# Patient Record
Sex: Female | Born: 1955 | Race: White | Hispanic: No | Marital: Married | Smoking: Never smoker
Health system: Southern US, Community
[De-identification: ages and names within clinical notes are randomized; demographics above are authoritative.]

## PROBLEM LIST (undated history)

## (undated) DIAGNOSIS — S4292XA Fracture of left shoulder girdle, part unspecified, initial encounter for closed fracture: Secondary | ICD-10-CM

## (undated) DIAGNOSIS — E039 Hypothyroidism, unspecified: Secondary | ICD-10-CM

## (undated) DIAGNOSIS — K219 Gastro-esophageal reflux disease without esophagitis: Secondary | ICD-10-CM

## (undated) DIAGNOSIS — F419 Anxiety disorder, unspecified: Secondary | ICD-10-CM

## (undated) DIAGNOSIS — Z1211 Encounter for screening for malignant neoplasm of colon: Secondary | ICD-10-CM

## (undated) DIAGNOSIS — E669 Obesity, unspecified: Secondary | ICD-10-CM

## (undated) DIAGNOSIS — R51 Headache: Secondary | ICD-10-CM

## (undated) DIAGNOSIS — J189 Pneumonia, unspecified organism: Secondary | ICD-10-CM

## (undated) DIAGNOSIS — N6019 Diffuse cystic mastopathy of unspecified breast: Secondary | ICD-10-CM

## (undated) DIAGNOSIS — I10 Essential (primary) hypertension: Secondary | ICD-10-CM

## (undated) DIAGNOSIS — G2401 Drug induced subacute dyskinesia: Secondary | ICD-10-CM

## (undated) DIAGNOSIS — N939 Abnormal uterine and vaginal bleeding, unspecified: Secondary | ICD-10-CM

## (undated) DIAGNOSIS — F32A Depression, unspecified: Secondary | ICD-10-CM

## (undated) DIAGNOSIS — F329 Major depressive disorder, single episode, unspecified: Secondary | ICD-10-CM

## (undated) DIAGNOSIS — T7840XA Allergy, unspecified, initial encounter: Secondary | ICD-10-CM

## (undated) HISTORY — PX: TONSILLECTOMY: SHX5217

## (undated) HISTORY — DX: Encounter for screening for malignant neoplasm of colon: Z12.11

## (undated) HISTORY — DX: Drug induced subacute dyskinesia: G24.01

## (undated) HISTORY — PX: COLONOSCOPY: SHX174

## (undated) HISTORY — PX: BREAST CYST ASPIRATION: SHX578

## (undated) HISTORY — DX: Allergy, unspecified, initial encounter: T78.40XA

## (undated) HISTORY — DX: Pneumonia, unspecified organism: J18.9

## (undated) HISTORY — DX: Diffuse cystic mastopathy of unspecified breast: N60.19

## (undated) HISTORY — PX: DILATION AND CURETTAGE OF UTERUS: SHX78

## (undated) HISTORY — DX: Fracture of left shoulder girdle, part unspecified, initial encounter for closed fracture: S42.92XA

## (undated) HISTORY — DX: Obesity, unspecified: E66.9

## (undated) HISTORY — DX: Abnormal uterine and vaginal bleeding, unspecified: N93.9

---

## 1986-08-20 HISTORY — PX: NASAL SINUS SURGERY: SHX719

## 2000-01-31 ENCOUNTER — Other Ambulatory Visit: Admission: RE | Admit: 2000-01-31 | Discharge: 2000-01-31 | Payer: Self-pay | Admitting: Obstetrics and Gynecology

## 2001-05-27 ENCOUNTER — Other Ambulatory Visit: Admission: RE | Admit: 2001-05-27 | Discharge: 2001-05-27 | Payer: Self-pay | Admitting: Obstetrics and Gynecology

## 2002-06-11 ENCOUNTER — Other Ambulatory Visit: Admission: RE | Admit: 2002-06-11 | Discharge: 2002-06-11 | Payer: Self-pay | Admitting: Obstetrics and Gynecology

## 2002-08-20 HISTORY — PX: BREAST BIOPSY: SHX20

## 2003-08-21 HISTORY — PX: EXTERNAL EAR SURGERY: SHX627

## 2003-08-24 ENCOUNTER — Other Ambulatory Visit: Admission: RE | Admit: 2003-08-24 | Discharge: 2003-08-24 | Payer: Self-pay | Admitting: Obstetrics and Gynecology

## 2004-09-29 IMAGING — MG UNKNOWN MG STUDY
1 series · 4 of 4 positions shown · non-contrast
Comparison: none

REASON FOR EXAM: breast mass...hm [phone]

[L CC · left · 4 of 4 slices shown]
[im 1/4]
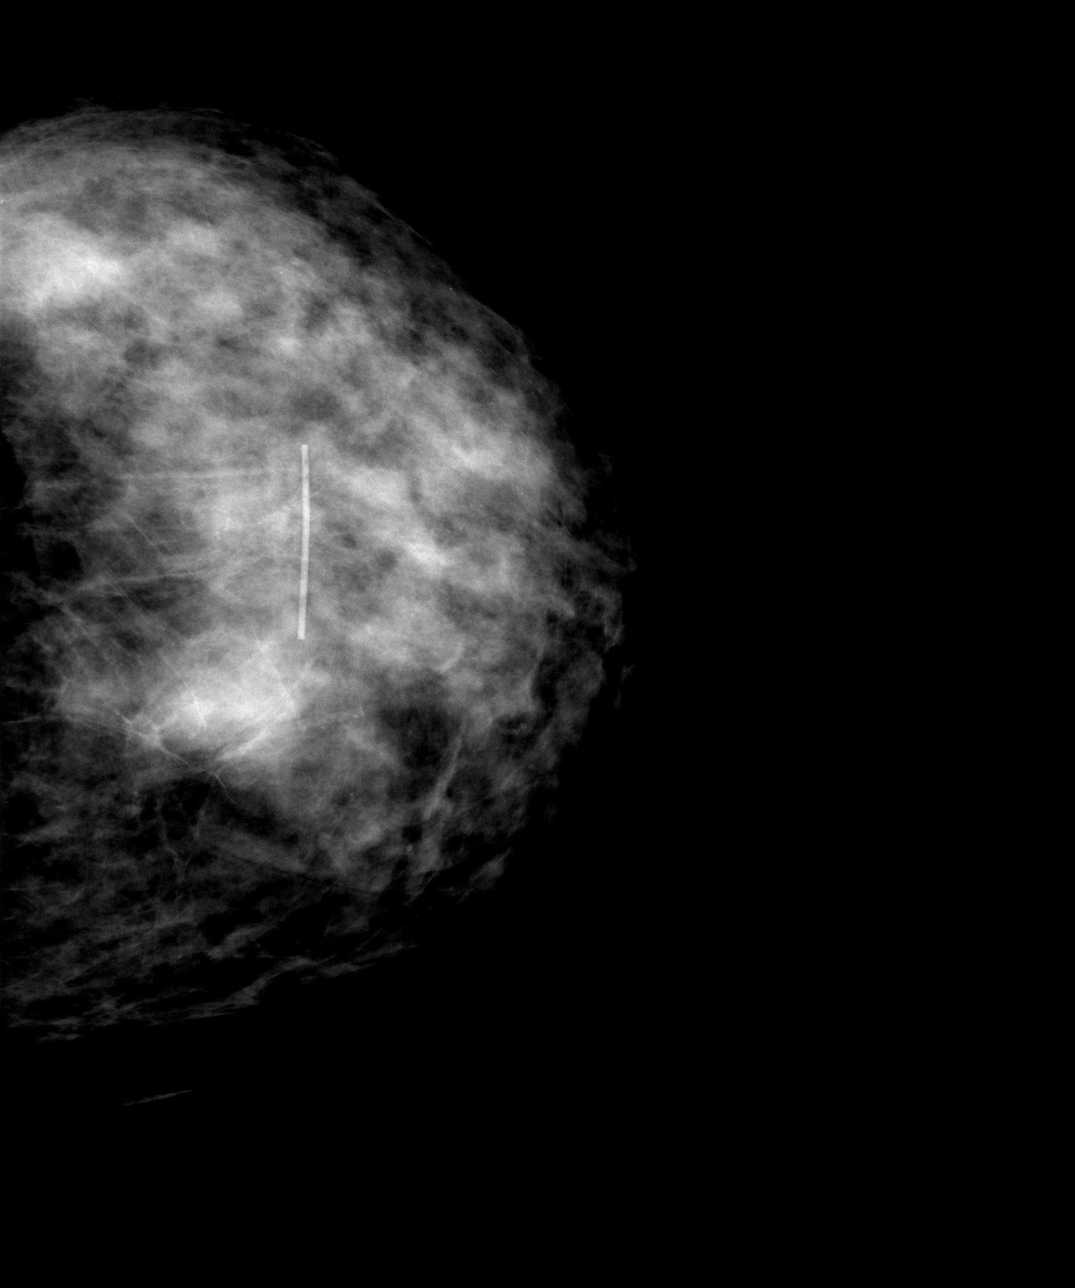
[im 2/4]
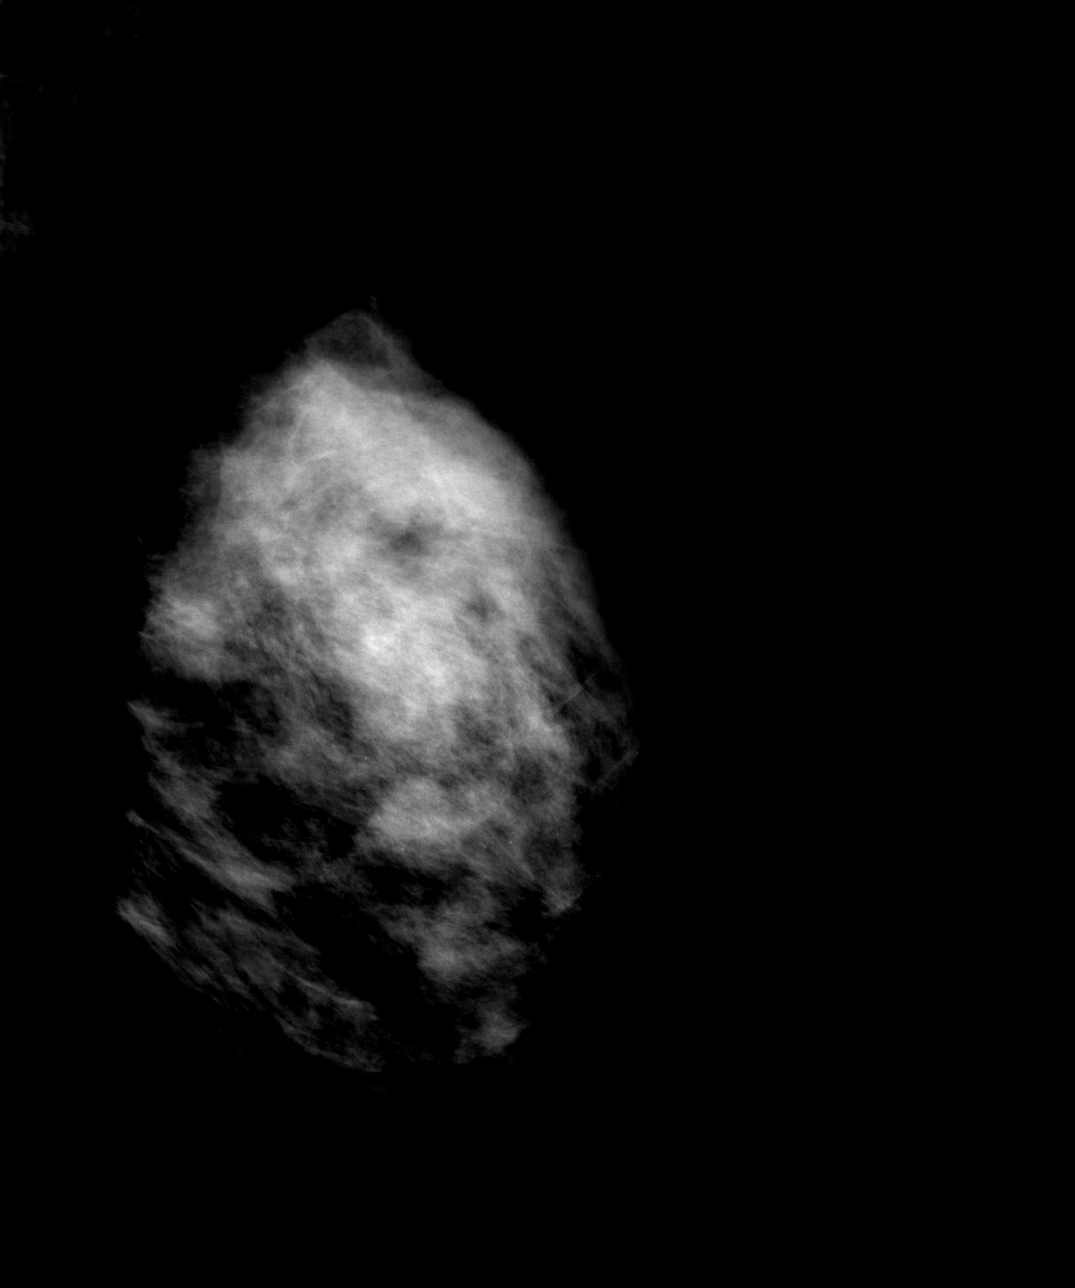
[im 3/4]
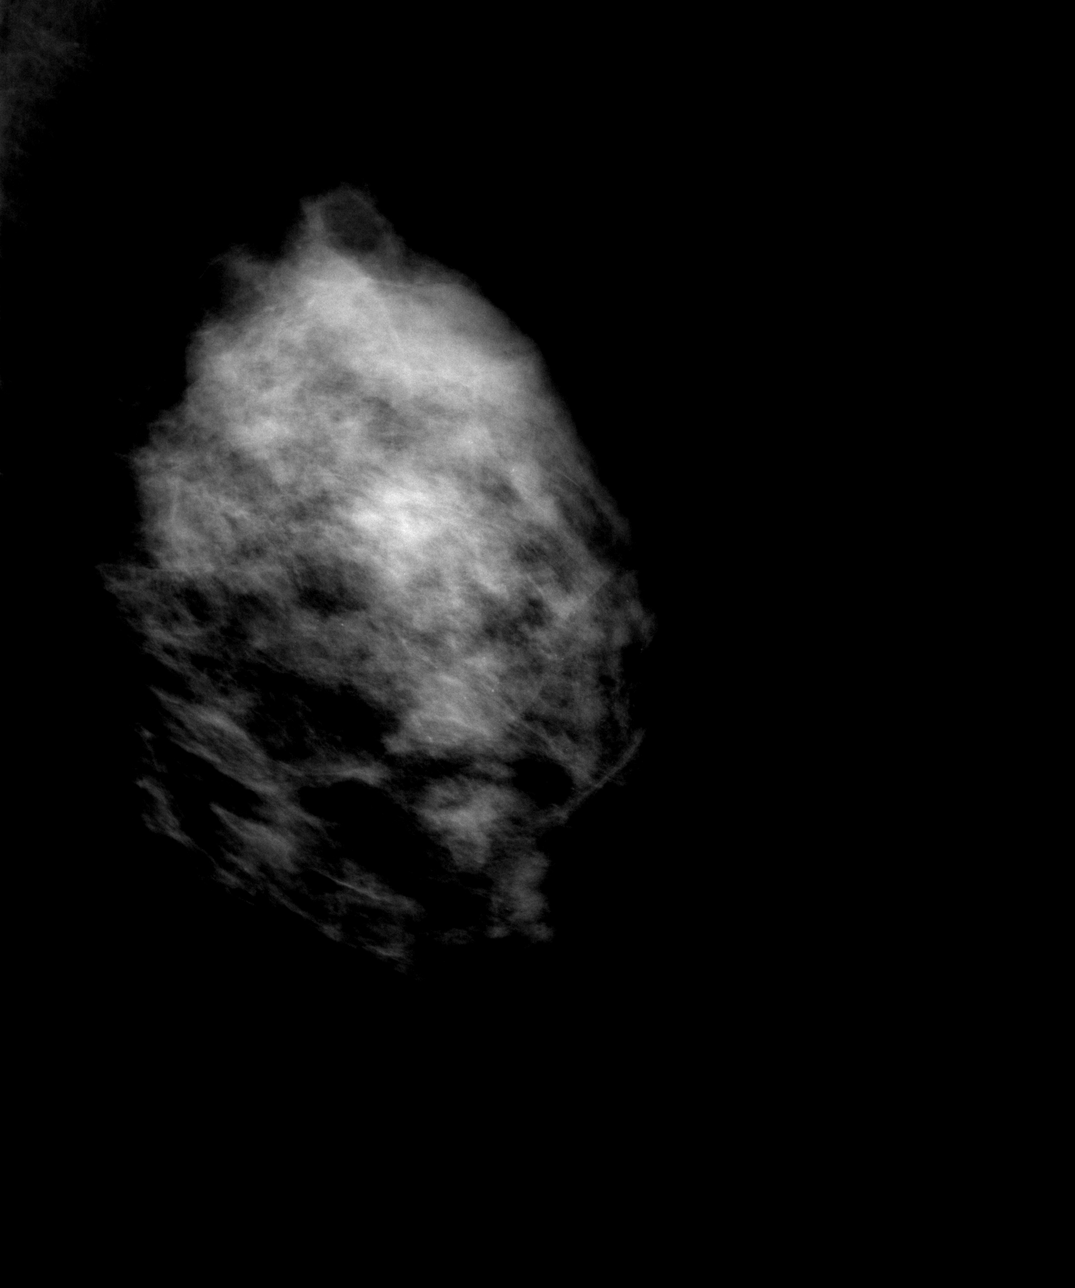
[im 4/4]
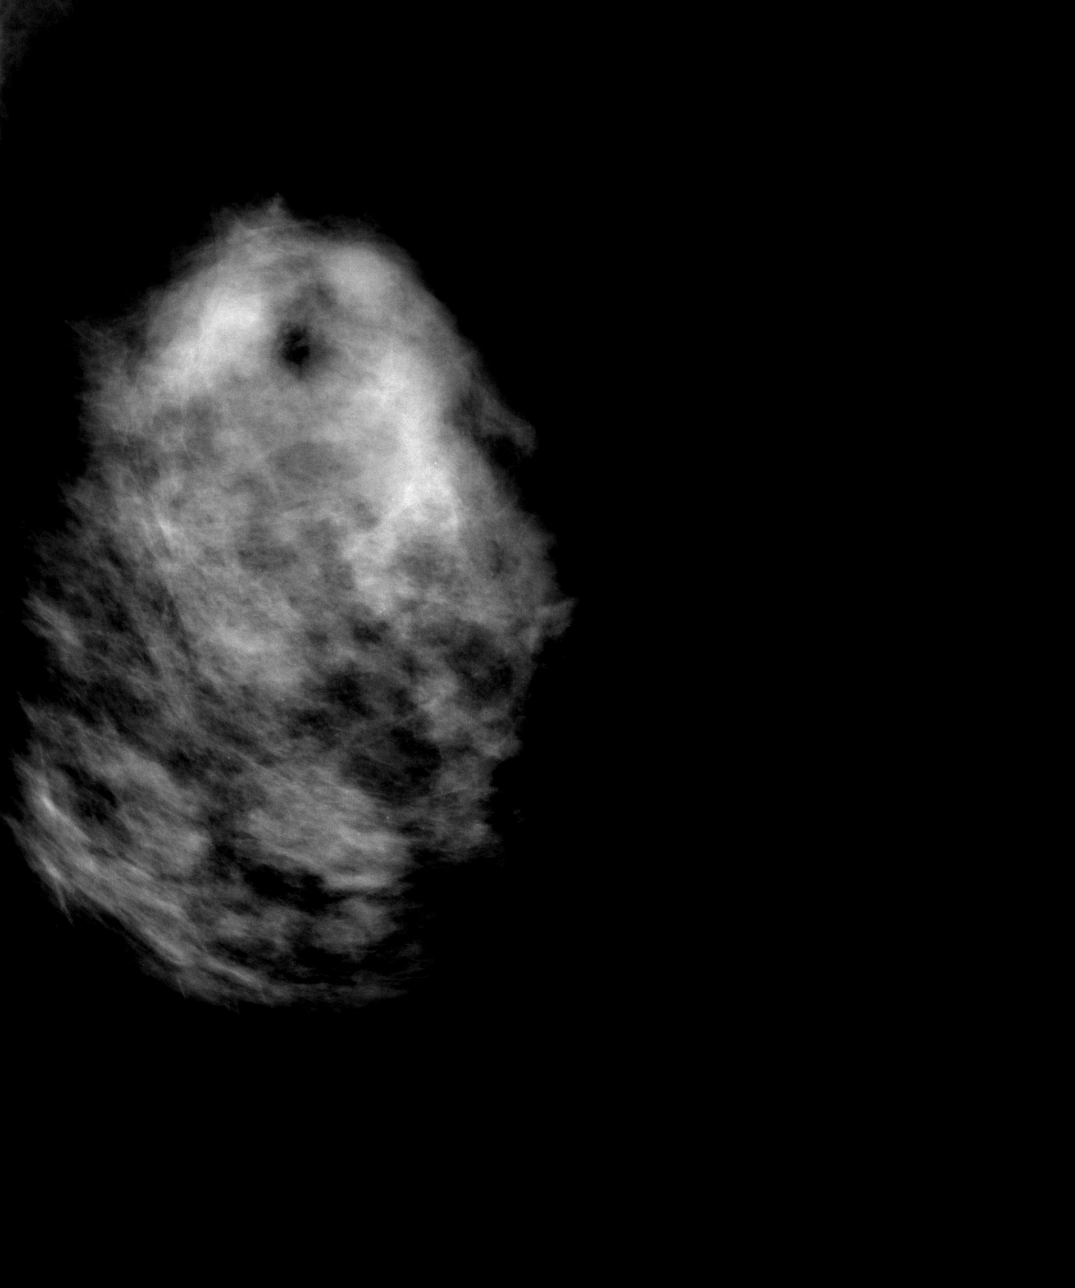

[4 of 4 positions shown; findings below may reference images not displayed]

Procedure: DIGITAL MAMMOGRAM OF THE LEFT BREAST 30 August, 2003:

 The patient has undergone surgery in the [REDACTED] of 1441 on the LEFT. In the
past the patient has had dominant density noted superiorly and laterally.
This is again seen. A stereotactic biopsy marker has been placed at
approximately the 12 oclock position of the upper retroareolar region. I
see no malignant appearing grouping of microcalcification. Very faint
microcalcifications are present on the LEFT in the lateral retroareolar
region but these do not appreciably changed.
IMPRESSION: 1)There are microcalcifications noted on the LEFT which are likely
reflective of sclerosing adenosis. I do not see findings that suggest
malignancy.

 BI-RADS: Category 3-Probably Benign Finding (interval follow-up).
Additional six month follow-up mammogram of the LEFT breast is recommended
at which time the RIGHT breast would be due i.e., in Wednesday February, 2004.

 A NEGATIVE MAMMOGRAM REPORT DOES NOT PRECLUDE BIOPSY OR OTHER EVALUATION OF
A CLINICALLY PALPABLE OR OTHERWISE SUSPICIOUS MASS OR LESION. BREAST CANCER
MAY NOT BE DETECTED BY MAMMOGRAPHY IN UP TO 10% OF CASES.

## 2004-11-20 ENCOUNTER — Other Ambulatory Visit: Admission: RE | Admit: 2004-11-20 | Discharge: 2004-11-20 | Payer: Self-pay | Admitting: Obstetrics and Gynecology

## 2005-05-03 ENCOUNTER — Ambulatory Visit: Payer: Self-pay | Admitting: General Surgery

## 2005-11-21 ENCOUNTER — Other Ambulatory Visit: Admission: RE | Admit: 2005-11-21 | Discharge: 2005-11-21 | Payer: Self-pay | Admitting: Obstetrics & Gynecology

## 2006-05-23 ENCOUNTER — Ambulatory Visit: Payer: Self-pay | Admitting: General Surgery

## 2006-11-27 ENCOUNTER — Other Ambulatory Visit: Admission: RE | Admit: 2006-11-27 | Discharge: 2006-11-27 | Payer: Self-pay | Admitting: Obstetrics and Gynecology

## 2007-05-27 ENCOUNTER — Ambulatory Visit: Payer: Self-pay | Admitting: General Surgery

## 2007-06-21 HISTORY — PX: BREAST SURGERY: SHX581

## 2007-08-21 DIAGNOSIS — K219 Gastro-esophageal reflux disease without esophagitis: Secondary | ICD-10-CM

## 2007-08-21 HISTORY — DX: Gastro-esophageal reflux disease without esophagitis: K21.9

## 2007-12-03 ENCOUNTER — Other Ambulatory Visit: Admission: RE | Admit: 2007-12-03 | Discharge: 2007-12-03 | Payer: Self-pay | Admitting: Obstetrics and Gynecology

## 2008-02-10 ENCOUNTER — Ambulatory Visit: Payer: Self-pay | Admitting: Internal Medicine

## 2008-02-27 ENCOUNTER — Ambulatory Visit: Payer: Self-pay | Admitting: Internal Medicine

## 2008-05-27 ENCOUNTER — Ambulatory Visit: Payer: Self-pay | Admitting: General Surgery

## 2008-06-07 ENCOUNTER — Ambulatory Visit: Payer: Self-pay | Admitting: General Surgery

## 2008-12-29 ENCOUNTER — Other Ambulatory Visit: Admission: RE | Admit: 2008-12-29 | Discharge: 2008-12-29 | Payer: Self-pay | Admitting: Obstetrics and Gynecology

## 2009-05-31 ENCOUNTER — Ambulatory Visit: Payer: Self-pay | Admitting: General Surgery

## 2009-06-03 ENCOUNTER — Ambulatory Visit: Payer: Self-pay | Admitting: General Surgery

## 2009-07-08 IMAGING — MG MAM DGTL ADD VW LT  SCR
2 series · 5 of 5 positions shown · non-contrast
Comparison: none

REASON FOR EXAM: left breast focal asymmetry
COMMENTS:

PROCEDURE:     MAM - MAM DGTL ADD VW LT  SCR  - June 07, 2008  [DATE]
RESULT:
HISTORY: 51-year-old female presents for further evaluation of LEFT breast
focal asymmetry.
COMPARISONS: 01-24-01, 02-28-04, 05-03-05, 05-23-06 and 05-27-07.

[L ML · left · 4 of 4 slices shown]
[im 1/4]
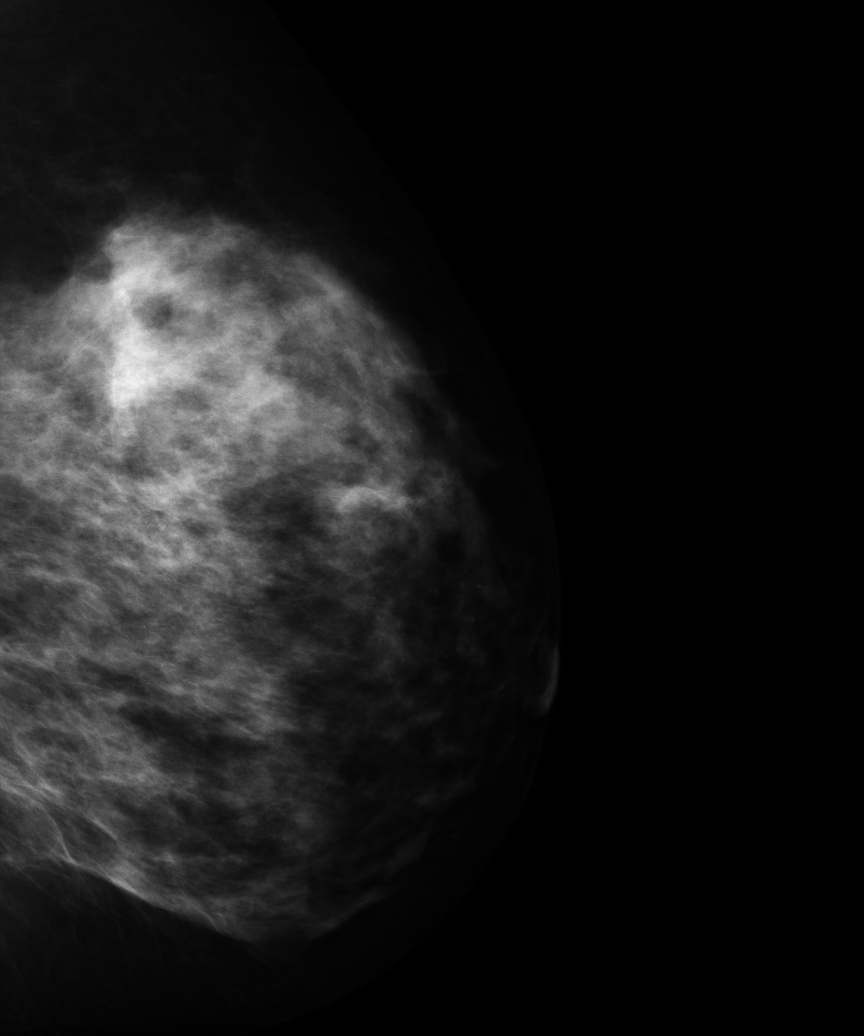
[im 2/4]
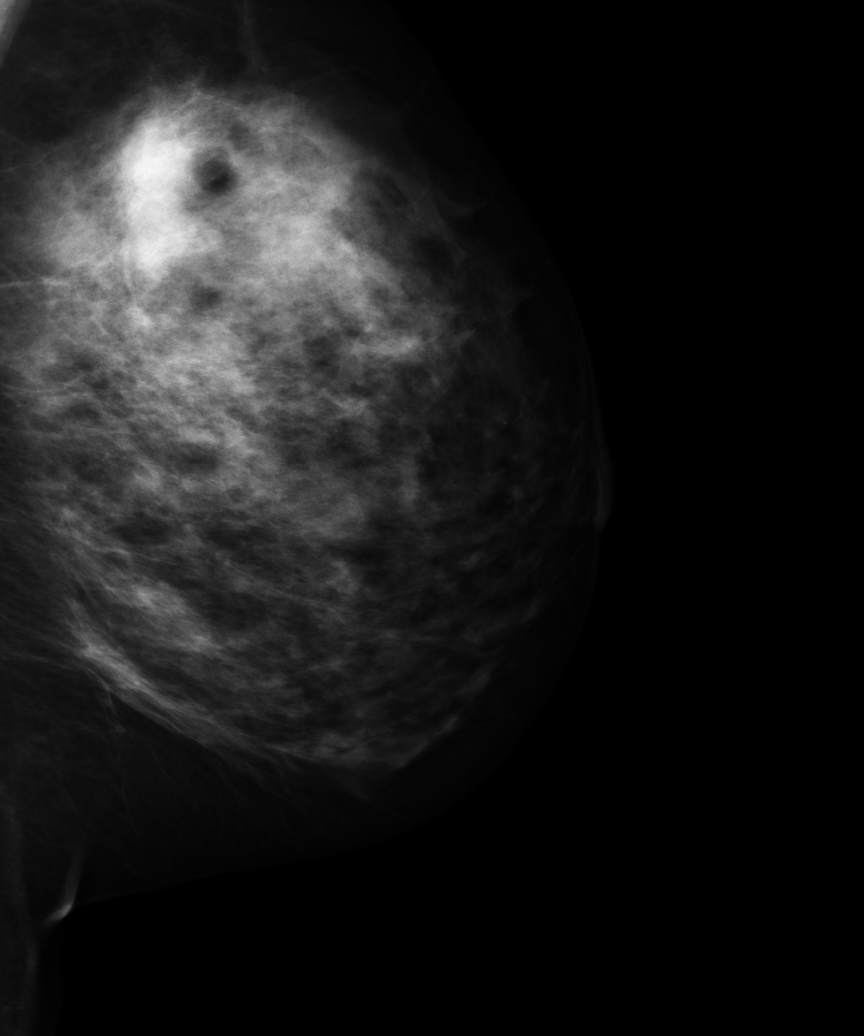
[im 3/4]
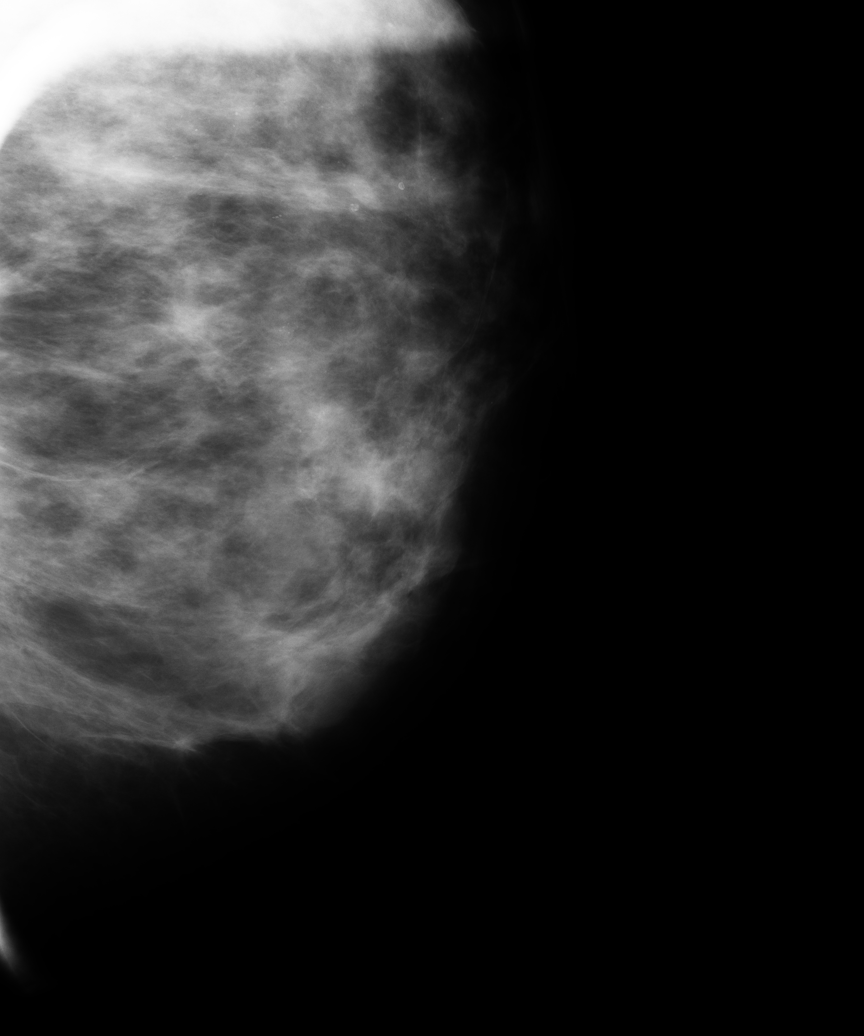
[im 4/4]
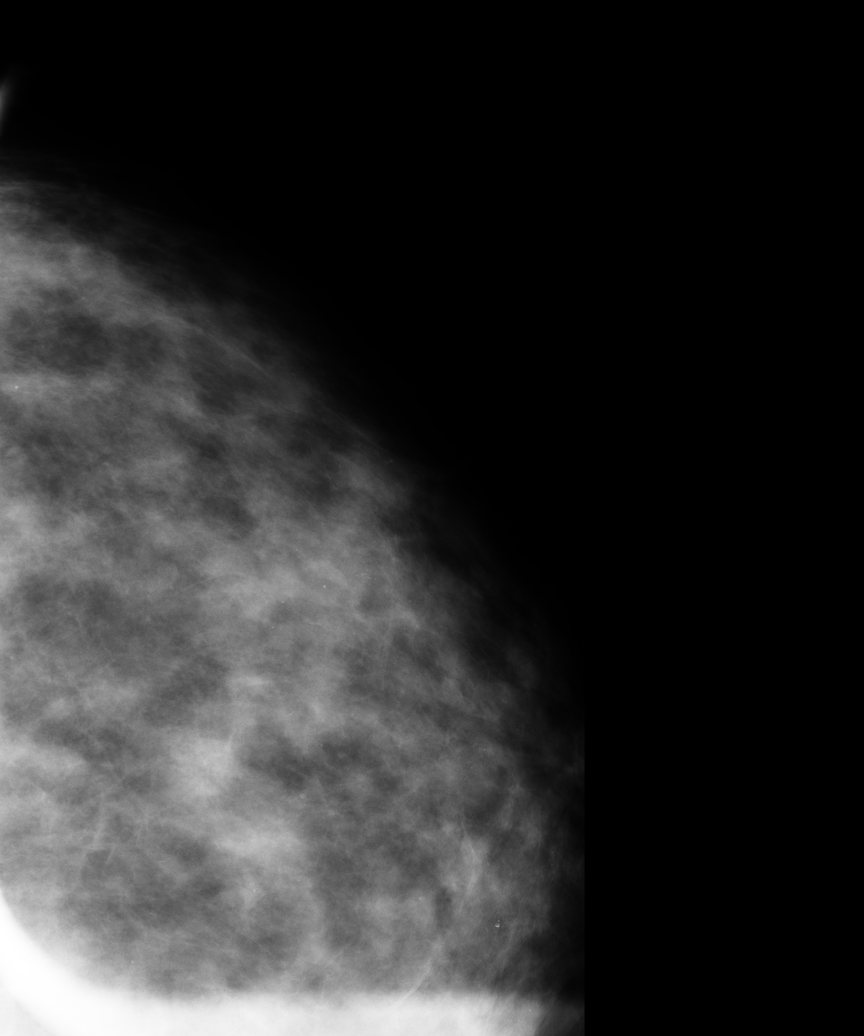

[L MLO]
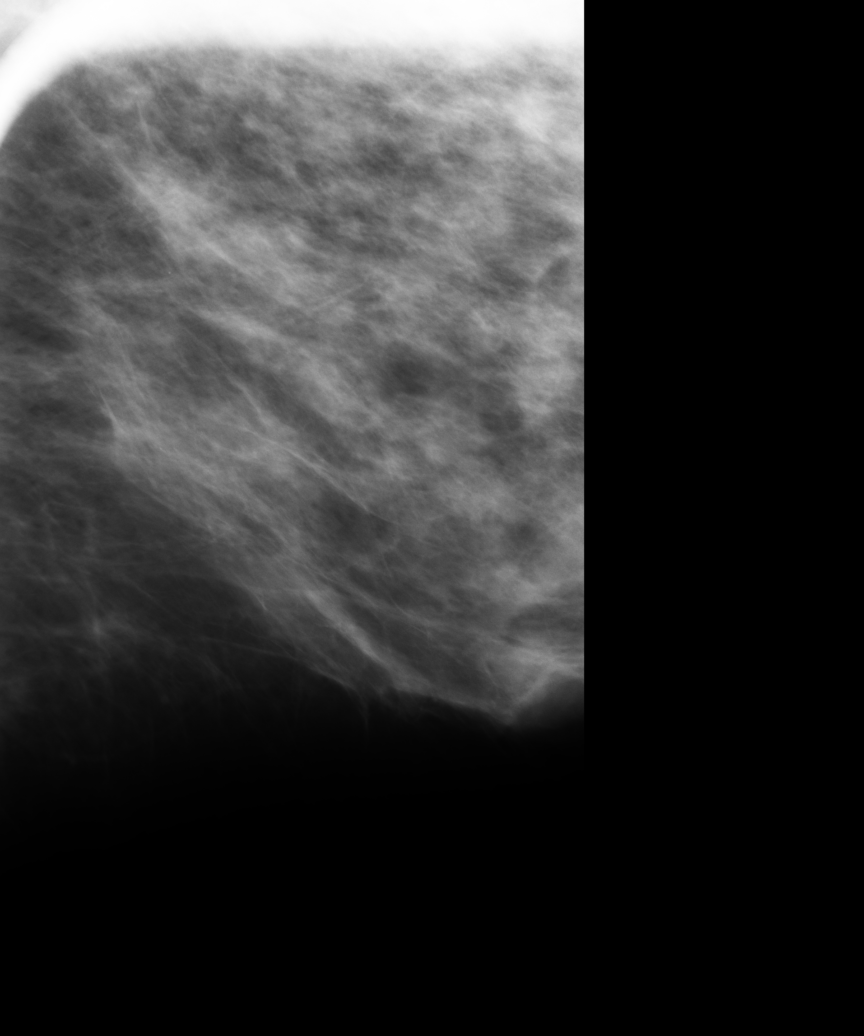

[5 of 5 positions shown; findings below may reference images not displayed]

FINDINGS: The LEFT breast is extremely dense which lowers the sensitivity of
mammography.

There is a focal asymmetry in the inferior LEFT breast not seen on the CC
view. Further evaluation is performed with true lateral view of the LEFT
breast and spot magnification MLO and CC views of the LEFT breast. With
compression the area of concern blends in with adjacent fibroglandular
tissue. There is no persistent mass or architectural distortion. There are
no suspicious appearing clusters of microcalcification.
IMPRESSION: BI-RADS: Category 2-Benign Finding

1. Return to annual mammographic follow-up is recommended.

A NEGATIVE MAMMOGRAM REPORT DOES NOT PRECLUDE BIOPSY OR OTHER EVALUATION OF
A CLINICALLY PALPABLE OR OTHERWISE SUSPICIOUS MASS OR LESION. BREAST CANCER
MAY NOT BE DETECTED BY MAMMOGRAPHY IN UP TO 10% OF CASES.

## 2009-08-20 DIAGNOSIS — J189 Pneumonia, unspecified organism: Secondary | ICD-10-CM

## 2009-08-20 DIAGNOSIS — E039 Hypothyroidism, unspecified: Secondary | ICD-10-CM

## 2009-08-20 HISTORY — DX: Hypothyroidism, unspecified: E03.9

## 2009-08-20 HISTORY — DX: Pneumonia, unspecified organism: J18.9

## 2009-08-20 HISTORY — PX: TRACHEOSTOMY: SUR1362

## 2010-06-01 ENCOUNTER — Ambulatory Visit: Payer: Self-pay | Admitting: General Surgery

## 2010-08-20 DIAGNOSIS — Z1211 Encounter for screening for malignant neoplasm of colon: Secondary | ICD-10-CM

## 2010-08-20 HISTORY — DX: Encounter for screening for malignant neoplasm of colon: Z12.11

## 2011-06-05 ENCOUNTER — Ambulatory Visit: Payer: Self-pay | Admitting: General Surgery

## 2011-08-21 DIAGNOSIS — N6019 Diffuse cystic mastopathy of unspecified breast: Secondary | ICD-10-CM

## 2011-08-21 DIAGNOSIS — E669 Obesity, unspecified: Secondary | ICD-10-CM

## 2011-08-21 HISTORY — DX: Obesity, unspecified: E66.9

## 2011-08-21 HISTORY — DX: Diffuse cystic mastopathy of unspecified breast: N60.19

## 2012-03-20 DIAGNOSIS — N939 Abnormal uterine and vaginal bleeding, unspecified: Secondary | ICD-10-CM

## 2012-03-20 HISTORY — PX: HYSTEROSCOPY: SHX211

## 2012-03-20 HISTORY — DX: Abnormal uterine and vaginal bleeding, unspecified: N93.9

## 2012-04-04 ENCOUNTER — Encounter (HOSPITAL_COMMUNITY): Payer: Self-pay | Admitting: Pharmacist

## 2012-04-04 NOTE — H&P (Signed)
Deborah Chase is an 56 y.o. female.   Chief Complaint: vaginal bleeding HPI:G5P1 with menstrual like flow for two weeks in July.  PUS/SHSG showed 3 endometrial polyps, 1.7 cm, 1.5 cm. And 0.6 cm.  Uterus 7 x 4 x 5 cm.  Adnexa wnl.  PMH:  Prolonged hospitalization for ARDS, with tracheotomy in 2011.               Major depressive episode with hospitalization            Hypothyroid            HTN PSH:   C section             Breast bx  FH:      Brother with AODM  Social History: neg smoker.  Married, one child, Buyer, retail.  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  Husband Onalee Hua. Allergies: Sulfa causes mouth sores  Medications:  Levoxyl, metoprolol, wellbutrin, cymbalta  Review of Systems  All other systems reviewed and are negative.    There were no vitals taken for this visit. Physical Exam  Constitutional: She is oriented to person, place, and time. She appears well-developed.  HENT:  Head: Normocephalic and atraumatic.  Eyes: Conjunctivae are normal. Pupils are equal, round, and reactive to light.  Neck: Normal range of motion. Neck supple. No thyromegaly present.  Cardiovascular: Normal rate, regular rhythm and normal heart sounds.   Respiratory: Breath sounds normal. She is in respiratory distress. She has no wheezes.  GI: Soft. Bowel sounds are normal. She exhibits no distension.  Genitourinary: Vagina normal and uterus normal.  Musculoskeletal: Normal range of motion.  Neurological: She is alert and oriented to person, place, and time.  Skin: Skin is warm and dry.  Psychiatric: She has a normal mood and affect. Her behavior is normal. Thought content normal.     Assessment/Plan Post menopausal bleeding.  Endometrial polyps of SHSG.  Plan hysteroscopy with resection and D & C  [name],[name] 04/04/2012, 10:55 AM

## 2012-04-07 ENCOUNTER — Encounter (HOSPITAL_COMMUNITY): Payer: Self-pay | Admitting: *Deleted

## 2012-04-08 ENCOUNTER — Ambulatory Visit (HOSPITAL_COMMUNITY): Payer: Managed Care, Other (non HMO) | Admitting: Anesthesiology

## 2012-04-08 ENCOUNTER — Encounter (HOSPITAL_COMMUNITY)
Admission: RE | Disposition: A | Discharge status: Home / Self Care | Payer: Self-pay | Source: Ambulatory Visit | Attending: Obstetrics and Gynecology

## 2012-04-08 ENCOUNTER — Ambulatory Visit (HOSPITAL_COMMUNITY)
Admission: RE | Admit: 2012-04-08 | Discharge: 2012-04-08 | Disposition: A | Discharge status: Home / Self Care | Payer: Managed Care, Other (non HMO) | Source: Ambulatory Visit | Attending: Obstetrics and Gynecology | Admitting: Obstetrics and Gynecology

## 2012-04-08 ENCOUNTER — Encounter (HOSPITAL_COMMUNITY): Payer: Self-pay | Admitting: Anesthesiology

## 2012-04-08 DIAGNOSIS — N950 Postmenopausal bleeding: Secondary | ICD-10-CM | POA: Insufficient documentation

## 2012-04-08 DIAGNOSIS — N840 Polyp of corpus uteri: Secondary | ICD-10-CM | POA: Insufficient documentation

## 2012-04-08 HISTORY — DX: Essential (primary) hypertension: I10

## 2012-04-08 HISTORY — DX: Gastro-esophageal reflux disease without esophagitis: K21.9

## 2012-04-08 HISTORY — DX: Hypothyroidism, unspecified: E03.9

## 2012-04-08 HISTORY — DX: Anxiety disorder, unspecified: F41.9

## 2012-04-08 HISTORY — DX: Major depressive disorder, single episode, unspecified: F32.9

## 2012-04-08 HISTORY — DX: Depression, unspecified: F32.A

## 2012-04-08 HISTORY — DX: Headache: R51

## 2012-04-08 LAB — CBC
MCV: 92.5 fL (ref 78.0–100.0)
Platelets: 239 10*3/uL (ref 150–400)
RBC: 4.53 MIL/uL (ref 3.87–5.11)
RDW: 13.8 % (ref 11.5–15.5)
WBC: 6.4 10*3/uL (ref 4.0–10.5)

## 2012-04-08 SURGERY — DILATATION & CURETTAGE/HYSTEROSCOPY WITH RESECTOCOPE
Anesthesia: General | Site: Vagina | Wound class: Clean Contaminated

## 2012-04-08 MED ORDER — ONDANSETRON HCL 4 MG/2ML IJ SOLN
INTRAMUSCULAR | Status: AC
  Filled 2012-04-08: qty 2

## 2012-04-08 MED ORDER — PROPOFOL 10 MG/ML IV EMUL
INTRAVENOUS | Status: DC | PRN
  Administered 2012-04-08: 150 mg via INTRAVENOUS

## 2012-04-08 MED ORDER — KETOROLAC TROMETHAMINE 30 MG/ML IJ SOLN
INTRAMUSCULAR | Status: DC | PRN
  Administered 2012-04-08: 30 mg via INTRAVENOUS

## 2012-04-08 MED ORDER — GLYCINE 1.5 % IR SOLN
Status: DC | PRN
  Administered 2012-04-08: 3000 mL

## 2012-04-08 MED ORDER — ONDANSETRON HCL 4 MG/2ML IJ SOLN
INTRAMUSCULAR | Status: DC | PRN
  Administered 2012-04-08: 4 mg via INTRAVENOUS

## 2012-04-08 MED ORDER — LIDOCAINE HCL 1 % IJ SOLN
INTRAMUSCULAR | Status: DC | PRN
  Administered 2012-04-08: 20 mL

## 2012-04-08 MED ORDER — MIDAZOLAM HCL 2 MG/2ML IJ SOLN
INTRAMUSCULAR | Status: AC
  Filled 2012-04-08: qty 2

## 2012-04-08 MED ORDER — MIDAZOLAM HCL 5 MG/5ML IJ SOLN
INTRAMUSCULAR | Status: DC | PRN
  Administered 2012-04-08: 2 mg via INTRAVENOUS

## 2012-04-08 MED ORDER — FENTANYL CITRATE 0.05 MG/ML IJ SOLN
INTRAMUSCULAR | Status: AC
  Filled 2012-04-08: qty 2

## 2012-04-08 MED ORDER — LIDOCAINE HCL (CARDIAC) 20 MG/ML IV SOLN
INTRAVENOUS | Status: AC
  Filled 2012-04-08: qty 5

## 2012-04-08 MED ORDER — FENTANYL CITRATE 0.05 MG/ML IJ SOLN
INTRAMUSCULAR | Status: DC | PRN
  Administered 2012-04-08: 100 ug via INTRAVENOUS

## 2012-04-08 MED ORDER — KETOROLAC TROMETHAMINE 30 MG/ML IJ SOLN
INTRAMUSCULAR | Status: AC
  Filled 2012-04-08: qty 1

## 2012-04-08 MED ORDER — FENTANYL CITRATE 0.05 MG/ML IJ SOLN: 25.0000 ug | INTRAMUSCULAR | Status: DC | PRN

## 2012-04-08 MED ORDER — DEXAMETHASONE SODIUM PHOSPHATE 10 MG/ML IJ SOLN
INTRAMUSCULAR | Status: AC
  Filled 2012-04-08: qty 1

## 2012-04-08 MED ORDER — LACTATED RINGERS IV SOLN
INTRAVENOUS | Status: DC
  Administered 2012-04-08 (×2): via INTRAVENOUS

## 2012-04-08 MED ORDER — DEXAMETHASONE SODIUM PHOSPHATE 4 MG/ML IJ SOLN
INTRAMUSCULAR | Status: DC | PRN
  Administered 2012-04-08: 10 mg via INTRAVENOUS

## 2012-04-08 MED ORDER — PROPOFOL 10 MG/ML IV EMUL
INTRAVENOUS | Status: AC
  Filled 2012-04-08: qty 20

## 2012-04-08 MED ORDER — LIDOCAINE HCL (CARDIAC) 20 MG/ML IV SOLN
INTRAVENOUS | Status: DC | PRN
  Administered 2012-04-08: 60 mg via INTRAVENOUS

## 2012-04-08 SURGICAL SUPPLY — 18 items
CANISTER SUCTION 2500CC (MISCELLANEOUS) ×2 IMPLANT
CATH ROBINSON RED A/P 16FR (CATHETERS) ×2 IMPLANT
CONTAINER PREFILL 10% NBF 60ML (FORM) ×4 IMPLANT
CORD ACTIVE DISPOSABLE (ELECTRODE) ×1
CORD ELECTRO ACTIVE DISP (ELECTRODE) ×1 IMPLANT
ELECT LOOP GYNE PRO 24FR (CUTTING LOOP)
ELECT REM PT RETURN 9FT ADLT (ELECTROSURGICAL) ×2
ELECT VAPORTRODE GRVD BAR (ELECTRODE) IMPLANT
ELECTRODE LOOP GYNE PRO 24FR (CUTTING LOOP) IMPLANT
ELECTRODE REM PT RTRN 9FT ADLT (ELECTROSURGICAL) ×1 IMPLANT
GLOVE BIOGEL PI IND STRL 7.0 (GLOVE) ×2 IMPLANT
GLOVE BIOGEL PI INDICATOR 7.0 (GLOVE) ×2
GLOVE ECLIPSE 6.5 STRL STRAW (GLOVE) ×2 IMPLANT
GOWN PREVENTION PLUS LG XLONG (DISPOSABLE) ×4 IMPLANT
GOWN STRL REIN XL XLG (GOWN DISPOSABLE) ×2 IMPLANT
PACK HYSTEROSCOPY LF (CUSTOM PROCEDURE TRAY) ×2 IMPLANT
TOWEL OR 17X24 6PK STRL BLUE (TOWEL DISPOSABLE) ×4 IMPLANT
WATER STERILE IRR 1000ML POUR (IV SOLUTION) ×2 IMPLANT

## 2012-04-08 NOTE — Transfer of Care (Signed)
Immediate Anesthesia Transfer of Care Note  Patient: Deborah Chase  Procedure(s) Performed: Procedure(s) (LRB): DILATATION & CURETTAGE/HYSTEROSCOPY WITH RESECTOCOPE (N/A)  Patient Location: PACU  Anesthesia Type: General  Level of Consciousness: awake, alert  and oriented  Airway & Oxygen Therapy: Patient Spontanous Breathing and Patient connected to nasal cannula oxygen  Post-op Assessment: Report given to PACU RN and Post -op Vital signs reviewed and stable  Post vital signs: Reviewed and stable  Complications: No apparent anesthesia complications

## 2012-04-08 NOTE — Anesthesia Preprocedure Evaluation (Signed)
Anesthesia Evaluation  Patient identified by MRN, date of birth, ID band Patient awake    Reviewed: Allergy & Precautions, H&P , Patient's Chart, lab work & pertinent test results, reviewed documented beta blocker date and time   Airway Mallampati: II TM Distance: >3 FB Neck ROM: full    Dental No notable dental hx.    Pulmonary  breath sounds clear to auscultation  Pulmonary exam normal       Cardiovascular hypertension (controlled well, EKG Nl), Pt. on medications Rhythm:regular Rate:Normal     Neuro/Psych    GI/Hepatic GERD-  Medicated and Controlled,  Endo/Other    Renal/GU      Musculoskeletal   Abdominal   Peds  Hematology   Anesthesia Other Findings   Reproductive/Obstetrics                           Anesthesia Physical Anesthesia Plan  ASA: II  Anesthesia Plan: General   Post-op Pain Management:    Induction: Intravenous  Airway Management Planned: LMA  Additional Equipment:   Intra-op Plan:   Post-operative Plan:   Informed Consent: I have reviewed the patients History and Physical, chart, labs and discussed the procedure including the risks, benefits and alternatives for the proposed anesthesia with the patient or authorized representative who has indicated his/her understanding and acceptance.   Dental Advisory Given  Plan Discussed with: CRNA and Surgeon  Anesthesia Plan Comments: (  Discussed  general anesthesia, including possible nausea, instrumentation of airway, sore throat,pulmonary aspiration, etc. I asked if the were any outstanding questions, or  concerns before we proceeded. )        Anesthesia Quick Evaluation

## 2012-04-08 NOTE — Interval H&P Note (Signed)
History and Physical Interval Note:  04/08/2012 8:43 AM  Deborah Chase  has presented today for surgery, with the diagnosis of pmb  The various methods of treatment have been discussed with the patient and family. After consideration of risks, benefits and other options for treatment, the patient has consented to  Procedure(s) (LRB): DILATATION & CURETTAGE/HYSTEROSCOPY WITH RESECTOCOPE (N/A) as a surgical intervention .  The patient's history has been reviewed, patient examined, no change in status, stable for surgery.  I have reviewed the patient's chart and labs.  Questions were answered to the patient's satisfaction.     [name],[name]

## 2012-04-08 NOTE — Anesthesia Procedure Notes (Signed)
Procedure Name: LMA Insertion Date/Time: 04/08/2012 9:07 AM Performed by: Graciela Husbands Pre-anesthesia Checklist: Suction available, Emergency Drugs available, Timeout performed, Patient identified and Patient being monitored Patient Re-evaluated:Patient Re-evaluated prior to inductionOxygen Delivery Method: Circle system utilized Preoxygenation: Pre-oxygenation with 100% oxygen Intubation Type: IV induction LMA: LMA inserted LMA Size: 4.0 Number of attempts: 1 Placement Confirmation: positive ETCO2 and breath sounds checked- equal and bilateral Tube secured with: Tape Dental Injury: Teeth and Oropharynx as per pre-operative assessment

## 2012-04-08 NOTE — Op Note (Signed)
Preoperative diagnosis: Postmenopausal bleeding, multiple endometrial polyps seen on sonohysterogram Postoperative diagnosis: Same, path pending Procedure: Hysteroscopic resection of endometrial polyps, D&C Surgeon: Dr. Aram Beecham Caprina Wussow Anesthesia: Gen. by LMA Estimated blood loss: Minimal Glycine deficit: 120 cc Complications: None Seizure: The patient was taken to the operating room and after the induction of adequate general anesthesia by LMA she was placed in the dorsolithotomy position and prepped and draped in usual fashion.  The vagina was quite narrow and would not allow a posterior weighted retractor to be used because it was too wide.  Therefore a Deaver was used anteriorly and posteriorly for retraction, and the cervix was visualized and grasped with a single-tooth tenaculum.  The uterus sounded to 8 cm.  Cervix was dilated to a #31 Pratt.  A paracervical block was instituted using a total of 20 cc of 1% Xylocaine prior to the dilating of the cervix.  The resectoscope was inserted into the uterus.  Glycine was used as the distention medium and the pressure pump was set at 80 mm of mercury.  The endometrial cavity contained multiple endometrial polyps.  Photographic documentation was taken.  These polyps were removed using single loop cautery.  The specimens were sent to pathology.  Following completion of the removal of the polyps, photographic documentation was taken of the clean cavity.  The scope was withdrawn.  The endometrial cavity was gently curetted, and the specimen sent to pathology.  Instruments were removed and the vagina, and the procedure was terminated.  Sponge needle and instrument counts were correct.  The patient tolerated it well and when Saturday condition to post anesthesia recovery.

## 2012-04-08 NOTE — Anesthesia Postprocedure Evaluation (Signed)
Anesthesia Post Note  Patient: Deborah Chase  Procedure(s) Performed: Procedure(s) (LRB): DILATATION & CURETTAGE/HYSTEROSCOPY WITH RESECTOCOPE (N/A)  Anesthesia type: General  Patient location: PACU  Post pain: Pain level controlled  Post assessment: Post-op Vital signs reviewed  Last Vitals:  Filed Vitals:   04/08/12 1102  BP: 129/77  Pulse: 66  Temp:   Resp: 19    Post vital signs: Reviewed  Level of consciousness: sedated  Complications: No apparent anesthesia complications

## 2012-06-05 ENCOUNTER — Ambulatory Visit: Payer: Self-pay | Admitting: General Surgery

## 2013-01-28 ENCOUNTER — Encounter: Payer: Self-pay | Admitting: *Deleted

## 2013-03-23 ENCOUNTER — Encounter: Payer: Self-pay | Admitting: Obstetrics and Gynecology

## 2013-03-23 ENCOUNTER — Ambulatory Visit (INDEPENDENT_AMBULATORY_CARE_PROVIDER_SITE_OTHER): Payer: Managed Care, Other (non HMO) | Admitting: Obstetrics and Gynecology

## 2013-03-23 ENCOUNTER — Encounter: Payer: Self-pay | Admitting: *Deleted

## 2013-03-23 ENCOUNTER — Other Ambulatory Visit: Payer: Self-pay | Admitting: *Deleted

## 2013-03-23 VITALS — BP 122/72 | HR 78 | Resp 16 | Ht 65.0 in | Wt 210.0 lb

## 2013-03-23 DIAGNOSIS — Z01419 Encounter for gynecological examination (general) (routine) without abnormal findings: Secondary | ICD-10-CM

## 2013-03-23 NOTE — Patient Instructions (Addendum)

## 2013-03-23 NOTE — Progress Notes (Signed)
57 y.o.   Married    Caucasian   female   [phone]   here for annual exam.  No further PMB since hysteroscopic resection last August.    Patient's last menstrual period was 02/21/2012.          Sexually active: no  The current method of family planning is abstinence and post menopausal status.    Exercising: walking 2-3 days a week Last mammogram:  2013 normal Last pap smear:01/06/10 neg History of abnormal pap: no Smoking: never Alcohol: no Last colonoscopy:2005 normal, repeat in 10years Last Bone Density:  2012 normal  Last tetanus shot: 2000 Last cholesterol check: 09/2012 normal(PCP)  Hgb:    pcp            Urine: pcp   Family History  Problem Relation Age of Onset  . Diabetes Brother   . Crohn's disease Mother   . Hypertension Mother   . Hypertension Father     There are no active problems to display for this patient.   Past Medical History  Diagnosis Date  . Depression   . Hypertension     controlled with metoprolol  . Headache(784.0)   . Anxiety   . Allergy   . Diffuse cystic mastopathy 2013  . Hypothyroidism 2011  . Special screening for malignant neoplasms, colon 2012  . Obesity, unspecified 2013  . GERD (gastroesophageal reflux disease) 2009    otc medicine  . Pneumonia 2011  . Abnormal uterine bleeding 03-2012    PMB/ Hysteroscopic Polyp resection    Past Surgical History  Procedure Laterality Date  . Nasal sinus surgery  1988  . Cesarean section  1994  . External ear surgery  2005    ? type  . Tracheostomy  2011  . Tonsillectomy    . 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 Breast biopsy  2004    fibrosis  . Colonoscopy  2005    [facility] ?? [place]  . Hysteroscopy  03/2012    resection of endometrial polyps  . Dilation and curettage of uterus      Allergies: Sulfa antibiotics  Current Outpatient Prescriptions  Medication Sig Dispense Refill  . ARIPiprazole (ABILIFY) 10 MG tablet Take 10 mg by mouth  daily.      Marland Kitchen buPROPion (WELLBUTRIN XL) 300 MG 24 hr tablet Take 300 mg by mouth at bedtime.      . DULoxetine (CYMBALTA) 20 MG capsule Take 20 mg by mouth 2 (two) times daily.      Marland Kitchen levothyroxine (SYNTHROID, LEVOTHROID) 25 MCG tablet Take 37.5 mcg by mouth daily.      . metoprolol tartrate (LOPRESSOR) 25 MG tablet Take 25 mg by mouth 2 (two) times daily.       No current facility-administered medications for this visit.    ROS: Pertinent items are noted in HPI.  Social Hx:  Married, one daughter Beth born 14 (delivered by [facility]) in collunity college to become a Museum/gallery conservator , works as a Designer, industrial/product  Exam:    BP 122/72  Pulse 78  Resp 16  Ht 5\' 5"  (1.651 m)  Wt 210 lb (95.255 kg)  BMI 34.95 kg/m2  LMP 07/04/2013Ht stable and wt up 3 pounds from last year   Wt Readings from Last 3 Encounters:  03/23/13 210 lb (95.255 kg)  04/07/12 203 lb (92.08 kg)  04/07/12 203 lb (92.08 kg)     Ht Readings from Last  3 Encounters:  03/23/13 5\' 5"  (1.651 m)  04/07/12 5\' 7"  (1.702 m)  04/07/12 5\' 7"  (1.702 m)    General appearance: alert, cooperative and appears stated age Head: Normocephalic, without obvious abnormality, atraumatic Neck: no adenopathy, supple, symmetrical, trachea midline and thyroid not enlarged, symmetric, no tenderness/mass/nodules Lungs: clear to auscultation bilaterally Breasts: Inspection negative, No nipple retraction or dimpling, No nipple discharge or bleeding, No axillary or supraclavicular adenopathy, Normal to palpation without dominant masses Heart: regular rate and rhythm Abdomen: soft, non-tender; bowel sounds normal; no masses,  no organomegaly Extremities: extremities normal, atraumatic, no cyanosis or edema Skin: Skin color, texture, turgor normal. No rashes or lesions Lymph nodes: Cervical, supraclavicular, and axillary nodes normal. No abnormal inguinal nodes palpated Neurologic: Grossly normal   Pelvic: External genitalia:  no lesions              Urethra:   normal appearing urethra with no masses, tenderness or lesions              Bartholins and Skenes: normal                 Vagina: normal appearing vagina with normal color and discharge, no lesions              Cervix: normal appearance              Pap taken: yes        Bimanual Exam:  Uterus:  uterus is normal size, shape, consistency and nontender, mid, mobile                                      Adnexa: normal adnexa in size, nontender and no masses                                      Rectovaginal: Confirms                                      Anus:  normal sphincter tone, no lesions  A: normal menopausal exam, no HRT     Depression, hospitalized      Prolonged hospitalization for ARDS in 2011     S/p hysteroscopic resection of polyps d/t PMB in August 2013     P:     mammogram pap smear counseled on breast self exam, mammography screening, adequate intake of calcium and vitamin D, diet and exercise return annually or prn     An After Visit Summary was printed and given to the patient.

## 2013-03-26 LAB — IPS PAP TEST WITH HPV

## 2013-06-08 ENCOUNTER — Ambulatory Visit: Payer: Self-pay | Admitting: General Surgery

## 2013-06-08 ENCOUNTER — Encounter: Payer: Self-pay | Admitting: General Surgery

## 2013-06-15 ENCOUNTER — Ambulatory Visit (INDEPENDENT_AMBULATORY_CARE_PROVIDER_SITE_OTHER): Payer: Managed Care, Other (non HMO) | Admitting: General Surgery

## 2013-06-15 ENCOUNTER — Encounter: Payer: Self-pay | Admitting: General Surgery

## 2013-06-15 VITALS — BP 130/80 | HR 74 | Resp 12 | Ht 66.0 in | Wt 215.0 lb

## 2013-06-15 DIAGNOSIS — Z1239 Encounter for other screening for malignant neoplasm of breast: Secondary | ICD-10-CM

## 2013-06-15 DIAGNOSIS — N6019 Diffuse cystic mastopathy of unspecified breast: Secondary | ICD-10-CM

## 2013-06-15 DIAGNOSIS — Z1211 Encounter for screening for malignant neoplasm of colon: Secondary | ICD-10-CM

## 2013-06-15 MED ORDER — POLYETHYLENE GLYCOL 3350 17 GM/SCOOP PO POWD: ORAL | Status: DC

## 2013-06-15 NOTE — Progress Notes (Signed)
Patient ID: Deborah Chase, female   DOB: [birth year], 57 y.o.   MRN: [id number]  Chief Complaint  Patient presents with  . Follow-up    mammogram    HPI Deborah Chase is a 57 y.o. female who presents for a breast evaluation. The most recent mammogram was done on 06/08/13. Patient does perform regular self breast checks and gets regular mammograms done.  The patient states a new onset of right breast discomfort noticed approximately 1 month ago. The discomfort comes and goes and has stayed the same. She hasn't tried anything to alleviated the discomfort she states its not bad when present. The patient is due for screening colonoscopy. Last one done 10 years ago.   HPI  Past Medical History  Diagnosis Date  . Depression   . Hypertension     controlled with metoprolol  . Headache(784.0)   . Anxiety   . Allergy   . Diffuse cystic mastopathy 2013  . Hypothyroidism 2011  . Special screening for malignant neoplasms, colon 2012  . Obesity, unspecified 2013  . GERD (gastroesophageal reflux disease) 2009    otc medicine  . Pneumonia 2011  . Abnormal uterine bleeding 03-2012    PMB/ Hysteroscopic Polyp resection    Past Surgical History  Procedure Laterality Date  . Nasal sinus surgery  1988  . Cesarean section  1994  . External ear surgery  2005    ? type  . Tracheostomy  2011  . Tonsillectomy    . 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 Breast biopsy  2004    fibrosis  . Colonoscopy  2005    [facility] ?? [place]  . Hysteroscopy  03/2012    resection of endometrial polyps  . Dilation and curettage of uterus      Family History  Problem Relation Age of Onset  . Diabetes Brother   . Crohn's disease Mother   . Hypertension Mother   . Hypertension Father     Social History History  Substance Use Topics  . Smoking status: Never Smoker   . Smokeless tobacco: Never Used  . Alcohol Use: No    Allergies  Allergen Reactions  . Sulfa  Antibiotics Swelling and Rash    Bumps in mouth    Current Outpatient Prescriptions  Medication Sig Dispense Refill  . ARIPiprazole (ABILIFY) 10 MG tablet Take 10 mg by mouth daily.      Marland Kitchen buPROPion (WELLBUTRIN XL) 300 MG 24 hr tablet Take 300 mg by mouth at bedtime.      . DULoxetine (CYMBALTA) 20 MG capsule Take 20 mg by mouth 2 (two) times daily.      Marland Kitchen levothyroxine (SYNTHROID, LEVOTHROID) 25 MCG tablet Take 37.5 mcg by mouth daily.      . metoprolol tartrate (LOPRESSOR) 25 MG tablet Take 25 mg by mouth 2 (two) times daily.      Marland Kitchen omeprazole (PRILOSEC) 40 MG capsule Take 40 mg by mouth daily.      . polyethylene glycol powder (GLYCOLAX/MIRALAX) powder 255 grams one bottle for colonoscopy prep  255 g  0   No current facility-administered medications for this visit.    Review of Systems Review of Systems  Constitutional: Negative.   Respiratory: Negative.   Cardiovascular: Negative.     Blood pressure 130/80, pulse 74, resp. rate 12, height 5\' 6"  (1.676 m), weight 215 lb (97.523 kg), last menstrual period 02/21/2012.  Physical Exam Physical Exam  Constitutional: She is oriented to person, place, and time. She appears well-developed and well-nourished.  Eyes: Conjunctivae are normal. No scleral icterus.  Neck: Neck supple. No mass and no thyromegaly present.  Cardiovascular: Normal rate, regular rhythm and normal heart sounds.   Pulmonary/Chest: Effort normal and breath sounds normal. Right breast exhibits no inverted nipple, no mass, no nipple discharge, no skin change and no tenderness. Left breast exhibits no inverted nipple, no mass, no nipple discharge, no skin change and no tenderness.  Abdominal: Soft. Bowel sounds are normal.  Lymphadenopathy:    She has no cervical adenopathy.    She has no axillary adenopathy.  Neurological: She is alert and oriented to person, place, and time.  Skin: Skin is warm and dry.    Data Reviewed Mammogram reviewed   Assessment     Stable exam      Plan    Patient to return in one year for bilateral screening mammogram. Patient to be scheduled for screening colonoscopy.     This patient has been scheduled for a colonoscopy on 07-14-13 at [facility].   Deaira Leckey G 06/15/2013, 12:38 PM

## 2013-06-15 NOTE — Patient Instructions (Addendum)
Patient to return in one year screening mammogram. Patient to call if any new questions or concerns arise.    Colonoscopy A colonoscopy is an exam to evaluate your entire colon. In this exam, your colon is cleansed. A long fiberoptic tube is inserted through your rectum and into your colon. The fiberoptic scope (endoscope) is a long bundle of enclosed and very flexible fibers. These fibers transmit light to the area examined and send images from that area to your caregiver. Discomfort is usually minimal. You may be given a drug to help you sleep (sedative) during or prior to the procedure. This exam helps to detect lumps (tumors), polyps, inflammation, and areas of bleeding. Your caregiver may also take a small piece of tissue (biopsy) that will be examined under a microscope. LET YOUR CAREGIVER KNOW ABOUT:   Allergies to food or medicine.  Medicines taken, including vitamins, herbs, eyedrops, over-the-counter medicines, and creams.  Use of steroids (by mouth or creams).  Previous problems with anesthetics or numbing medicines.  History of bleeding problems or blood clots.  Previous surgery.  Other health problems, including diabetes and kidney problems.  Possibility of pregnancy, if this applies. BEFORE THE PROCEDURE   A clear liquid diet may be required for 2 days before the exam.  Ask your caregiver about changing or stopping your regular medications.  Liquid injections (enemas) or laxatives may be required.  A large amount of electrolyte solution may be given to you to drink over a short period of time. This solution is used to clean out your colon.  You should be present 60 minutes prior to your procedure or as directed by your caregiver. AFTER THE PROCEDURE   If you received a sedative or pain relieving medication, you will need to arrange for someone to drive you home.  Occasionally, there is a little blood passed with the first bowel movement. Do not be concerned. FINDING  OUT THE RESULTS OF YOUR TEST Not all test results are available during your visit. If your test results are not back during the visit, make an appointment with your caregiver to find out the results. Do not assume everything is normal if you have not heard from your caregiver or the medical facility. It is important for you to follow up on all of your test results. HOME CARE INSTRUCTIONS   It is not unusual to pass moderate amounts of gas and experience mild abdominal cramping following the procedure. This is due to air being used to inflate your colon during the exam. Walking or a warm pack on your belly (abdomen) may help.  You may resume all normal meals and activities after sedatives and medicines have worn off.  Only take over-the-counter or prescription medicines for pain, discomfort, or fever as directed by your caregiver. Do not use aspirin or blood thinners if a biopsy was taken. Consult your caregiver for medicine usage if biopsies were taken. SEEK IMMEDIATE MEDICAL CARE IF:   You have a fever.  You pass large blood clots or fill a toilet with blood following the procedure. This may also occur 10 to 14 days following the procedure. This is more likely if a biopsy was taken.  You develop abdominal pain that keeps getting worse and cannot be relieved with medicine. Document Released: 08/03/2000 Document Revised: 10/29/2011 Document Reviewed: 03/18/2008 [facility] Patient Information 2014 [place], [place].  Patient has been scheduled for a colonoscopy on 07-14-13 at [facility].

## 2013-06-19 ENCOUNTER — Other Ambulatory Visit: Payer: Self-pay | Admitting: General Surgery

## 2013-06-19 DIAGNOSIS — Z1211 Encounter for screening for malignant neoplasm of colon: Secondary | ICD-10-CM

## 2013-07-14 ENCOUNTER — Ambulatory Visit: Payer: Self-pay | Admitting: General Surgery

## 2013-07-14 DIAGNOSIS — Z1211 Encounter for screening for malignant neoplasm of colon: Secondary | ICD-10-CM

## 2013-07-14 DIAGNOSIS — K5730 Diverticulosis of large intestine without perforation or abscess without bleeding: Secondary | ICD-10-CM

## 2013-07-20 ENCOUNTER — Encounter: Payer: Self-pay | Admitting: General Surgery

## 2014-03-18 ENCOUNTER — Telehealth: Payer: Self-pay | Admitting: Obstetrics and Gynecology

## 2014-03-18 NOTE — Telephone Encounter (Signed)
Trying to confirm pts appt states " im sorry but the person you are trying to reach has a voicemail box that has not been setup yet goodbye"

## 2014-03-25 ENCOUNTER — Ambulatory Visit (INDEPENDENT_AMBULATORY_CARE_PROVIDER_SITE_OTHER): Payer: Managed Care, Other (non HMO) | Admitting: Obstetrics and Gynecology

## 2014-03-25 ENCOUNTER — Encounter: Payer: Self-pay | Admitting: Obstetrics and Gynecology

## 2014-03-25 VITALS — BP 125/80 | HR 68 | Resp 18 | Ht 65.0 in | Wt 211.2 lb

## 2014-03-25 DIAGNOSIS — Z01419 Encounter for gynecological examination (general) (routine) without abnormal findings: Secondary | ICD-10-CM

## 2014-03-25 DIAGNOSIS — Z0000 Encounter for general adult medical examination without abnormal findings: Secondary | ICD-10-CM

## 2014-03-25 DIAGNOSIS — L989 Disorder of the skin and subcutaneous tissue, unspecified: Secondary | ICD-10-CM

## 2014-03-25 LAB — POCT URINALYSIS DIPSTICK
Bilirubin, UA: NEGATIVE
Blood, UA: NEGATIVE
Glucose, UA: NEGATIVE
KETONES UA: NEGATIVE
LEUKOCYTES UA: NEGATIVE
Nitrite, UA: NEGATIVE
PH UA: 5
PROTEIN UA: NEGATIVE
UROBILINOGEN UA: NEGATIVE

## 2014-03-25 NOTE — Patient Instructions (Signed)

## 2014-03-25 NOTE — Progress Notes (Signed)
Patient ID: Deborah Chase, female   DOB: 02/01/1956, 58 y.o.   MRN: [id number] GYNECOLOGY VISIT  PCP:  Durward MallardMurali Pisharody, MD  Referring provider:   HPI: 58 y.o.   Married  Caucasian  female   [phone] with Patient's last menstrual period was 02/21/2012.   here for  AEX.   No problems with bleeding or spotting.   Takes Vitamin D daily, 5000 IU daily.  Limited dairy intake.   Hgb:  PCP Urine:  Neg  GYNECOLOGIC HISTORY: Patient's last menstrual period was 02/21/2012. Sexually active:  no Partner preference: female Contraception: postmenopausal   Menopausal hormone therapy: no DES exposure:  no  Blood transfusions:   no Sexually transmitted diseases:  no  GYN procedures and prior surgeries:  C-section, Left breast biopsy in 2004--fibrosis. Last mammogram: 06-08-13 wnl except for dense breasts:[facility].               Last pap and high risk HPV testing:  03-23-13 wnl:neg HR HPV  History of abnormal pap smear:  no   OB History   Grav Para Term Preterm Abortions TAB SAB Ect Mult Living   5 1 1  4 2 2   1      Obstetric Comments   Age with first menstruation-12 Age with first pregnancy-35       LIFESTYLE: Exercise:  walking             Tobacco: no Alcohol:no Drug use:  no  OTHER HEALTH MAINTENANCE: Tetanus/TDap:  Up to date with PCP Gardisil:             n/a Influenza:           05/2013 Zostavax:           n/a  Bone density:    11/2013 wnl with [place] in [place]--Dr. Michell HeinrichPisharody. Colonoscopy:   07/2013 normal with Dr. Mosetta [place]Sankor in [place].  Next colonoscopy due 07/2023  Cholesterol check: normal with PCP 04/2013  Family History  Problem Relation Age of Onset  . Diabetes Brother   . Crohn's disease Mother   . Hypertension Mother   . Hypertension Father   . Diabetes Brother     Patient Active Problem List   Diagnosis Date Noted  . Diffuse cystic mastopathy 06/15/2013   Past Medical History  Diagnosis Date  . Depression   . Hypertension    controlled with metoprolol  . Headache(784.0)   . Anxiety   . Allergy   . Diffuse cystic mastopathy 2013  . Hypothyroidism 2011  . Special screening for malignant neoplasms, colon 2012  . Obesity, unspecified 2013  . GERD (gastroesophageal reflux disease) 2009    otc medicine  . Pneumonia 2011  . Abnormal uterine bleeding 03-2012    PMB/ Hysteroscopic Polyp resection    Past Surgical History  Procedure Laterality Date  . Nasal sinus surgery  1988  . Cesarean section  1994  . External ear surgery  2005    ? type  . Tracheostomy  2011  . Tonsillectomy    . 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 Breast biopsy  2004    fibrosis  . Colonoscopy  2005    [facility] ?? [facility]  . Hysteroscopy  03/2012    resection of endometrial polyps  . Dilation and curettage of uterus      ALLERGIES: Sulfa antibiotics  Current Outpatient Prescriptions  Medication Sig Dispense Refill  . ARIPiprazole (ABILIFY)  10 MG tablet Take 10 mg by mouth daily.      Marland Kitchen buPROPion (WELLBUTRIN XL) 300 MG 24 hr tablet Take 300 mg by mouth at bedtime.      . DULoxetine (CYMBALTA) 20 MG capsule Take 20 mg by mouth 2 (two) times daily.      Marland Kitchen levothyroxine (SYNTHROID, LEVOTHROID) 25 MCG tablet Take 37.5 mcg by mouth daily.      . metoprolol tartrate (LOPRESSOR) 25 MG tablet Take 25 mg by mouth 2 (two) times daily.      Marland Kitchen omeprazole (PRILOSEC) 40 MG capsule Take 40 mg by mouth daily.       No current facility-administered medications for this visit.     ROS:  Pertinent items are noted in HPI.  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  PHYSICAL EXAMINATION:    BP 125/80  Pulse 68  Resp 18  Ht 5\' 5"  (1.651 m)  Wt 211 lb 3.2 oz (95.8 kg)  BMI 35.15 kg/m2  LMP 02/21/2012   Wt Readings from Last 3 Encounters:  03/25/14 211 lb 3.2 oz (95.8 kg)  06/15/13 215 lb (97.523 kg)  03/23/13 210 lb (95.255 kg)     Ht Readings from Last 3 Encounters:  03/25/14 5'  5" (1.651 m)  06/15/13 5\' 6"  (1.676 m)  03/23/13 5\' 5"  (1.651 m)    General appearance: alert, cooperative and appears stated age Head: Normocephalic, without obvious abnormality, atraumatic Neck: no adenopathy, supple, symmetrical, trachea midline and thyroid not enlarged, symmetric, no tenderness/mass/nodules Lungs: clear to auscultation bilaterally Breasts: Inspection negative, No nipple retraction or dimpling, No nipple discharge or bleeding, No axillary or supraclavicular adenopathy, Normal to palpation without dominant masses Heart: regular rate and rhythm Abdomen: soft, non-tender; no masses,  no organomegaly Extremities: extremities normal, atraumatic, no cyanosis or edema Skin: Skin color, texture, turgor normal.  Multiple scars of the skin.  Ulcer of the right leg and left arm.  Perioral ulceration.  Lymph nodes: Cervical, supraclavicular, and axillary nodes normal. No abnormal inguinal nodes palpated Neurologic: Grossly normal  Pelvic: External genitalia:  no lesions              Urethra:  normal appearing urethra with no masses, tenderness or lesions              Bartholins and Skenes: normal                 Vagina: normal appearing vagina with normal color and discharge, no lesions              Cervix: normal appearance              Pap and high risk HPV testing done: No..            Bimanual Exam:  Uterus:  uterus is normal size, shape, consistency and nontender                                      Adnexa: normal adnexa in size, nontender and no masses                                      Rectovaginal: Confirms  Anus:  normal sphincter tone, no lesions  ASSESSMENT  Normal gynecologic exam. Skin lesions of undetermined etiology.   PLAN  Mammogram recommended yearly.  Pap smear and high risk HPV testing not indicated.  Counseled on self breast exam, Calcium and vitamin D intake, exercise. Referral to Dermatology. Return annually  or prn   An After Visit Summary was printed and given to the patient.

## 2014-03-30 ENCOUNTER — Telehealth: Payer: Self-pay | Admitting: Obstetrics and Gynecology

## 2014-03-30 NOTE — Telephone Encounter (Signed)
Spoke with patient. Advised that she is schedule with [facility], Dr Doreen BeamWhitworth on Aug 17 @ 1010 and that she needs to arrive at 0950. Provided contact information and address for the office. Patient agreeable.

## 2014-04-16 ENCOUNTER — Encounter: Payer: Self-pay | Admitting: Obstetrics and Gynecology

## 2014-06-17 ENCOUNTER — Ambulatory Visit (INDEPENDENT_AMBULATORY_CARE_PROVIDER_SITE_OTHER): Payer: Managed Care, Other (non HMO) | Admitting: General Surgery

## 2014-06-17 ENCOUNTER — Ambulatory Visit: Payer: Managed Care, Other (non HMO) | Admitting: General Surgery

## 2014-06-17 ENCOUNTER — Encounter: Payer: Self-pay | Admitting: General Surgery

## 2014-06-17 VITALS — BP 140/80 | HR 80 | Resp 14 | Ht 66.0 in | Wt 216.0 lb

## 2014-06-17 DIAGNOSIS — N6012 Diffuse cystic mastopathy of left breast: Secondary | ICD-10-CM

## 2014-06-17 DIAGNOSIS — Z1239 Encounter for other screening for malignant neoplasm of breast: Secondary | ICD-10-CM

## 2014-06-17 NOTE — Patient Instructions (Addendum)
Patient will be asked to return to the office in one year with a bilateral screening mammogram.  Continue self breast exams. Call office for any new breast issues or concerns.  

## 2014-06-17 NOTE — Progress Notes (Signed)
Patient ID: Deborah Chase, female   DOB: 10/23/1955, 58 y.o.   MRN: [id number]  Chief Complaint  Patient presents with  . Follow-up    mammogram    HPI Deborah Chase is a 58 y.o. female who presents for a breast evaluation. The most recent mammogram was done on 06/09/14.  Patient does perform regular self breast checks and gets regular mammograms done.  Colonoscopy was done in December 2014 showing diverticulosis.  HPI  Past Medical History  Diagnosis Date  . Depression   . Hypertension     controlled with metoprolol  . Headache(784.0)   . Anxiety   . Allergy   . Diffuse cystic mastopathy 2013  . Hypothyroidism 2011  . Special screening for malignant neoplasms, colon 2012  . Obesity, unspecified 2013  . GERD (gastroesophageal reflux disease) 2009    otc medicine  . Pneumonia 2011  . Abnormal uterine bleeding 03-2012    PMB/ Hysteroscopic Polyp resection    Past Surgical History  Procedure Laterality Date  . Nasal sinus surgery  1988  . Cesarean section  1994  . External ear surgery  2005    ? type  . Tracheostomy  2011  . Tonsillectomy    . 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 Breast biopsy  2004    fibrosis  . Colonoscopy  [id number]    Dr. Evette CristalSankar  . Hysteroscopy  03/2012    resection of endometrial polyps  . Dilation and curettage of uterus      Family History  Problem Relation Age of Onset  . Diabetes Brother   . Crohn's disease Mother   . Hypertension Mother   . Hypertension Father   . Diabetes Brother     Social History History  Substance Use Topics  . Smoking status: Never Smoker   . Smokeless tobacco: Never Used  . Alcohol Use: No    Allergies  Allergen Reactions  . Sulfa Antibiotics Swelling and Rash    Bumps in mouth    Current Outpatient Prescriptions  Medication Sig Dispense Refill  . ARIPiprazole (ABILIFY) 10 MG tablet Take 10 mg by mouth daily.      Marland Kitchen. buPROPion (WELLBUTRIN XL)  300 MG 24 hr tablet Take 300 mg by mouth at bedtime.      . DULoxetine (CYMBALTA) 20 MG capsule Take 20 mg by mouth 2 (two) times daily.      Marland Kitchen. levothyroxine (SYNTHROID, LEVOTHROID) 25 MCG tablet Take 37.5 mcg by mouth daily.      . metoprolol tartrate (LOPRESSOR) 25 MG tablet Take 25 mg by mouth 2 (two) times daily.      Marland Kitchen. omeprazole (PRILOSEC) 40 MG capsule Take 40 mg by mouth daily.       No current facility-administered medications for this visit.    Review of Systems Review of Systems  Constitutional: Negative.   Respiratory: Negative.   Cardiovascular: Negative.     Blood pressure 140/80, pulse 80, resp. rate 14, height 5\' 6"  (1.676 m), weight 216 lb (97.977 kg), last menstrual period 02/21/2012.  Physical Exam Physical Exam  Constitutional: She is oriented to person, place, and time. She appears well-developed.  Eyes: Conjunctivae are normal. No scleral icterus.  Neck: Neck supple.  Cardiovascular: Normal rate, regular rhythm and normal heart sounds.   Pulmonary/Chest: Effort normal. Right breast exhibits no inverted nipple, no mass, no nipple discharge, no skin change and no tenderness. Left breast exhibits no  inverted nipple, no mass, no nipple discharge, no skin change and no tenderness.  Abdominal: Soft. Normal appearance and bowel sounds are normal. There is no hepatomegaly. There is no tenderness.  Lymphadenopathy:    She has no cervical adenopathy.    She has no axillary adenopathy.  Neurological: She is alert and oriented to person, place, and time.  Skin: Skin is warm and dry.    Data Reviewed Mammogram reviewed and stable.   Assessment    Stable exam. 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    Plan    Patient will be asked to return to the office in one year with a bilateral screening mammogram.       Berda Shelvin G 06/17/2014, 9:38 AM

## 2014-06-21 ENCOUNTER — Encounter: Payer: Self-pay | Admitting: General Surgery

## 2015-03-29 ENCOUNTER — Encounter: Payer: Self-pay | Admitting: General Surgery

## 2015-04-27 ENCOUNTER — Ambulatory Visit (INDEPENDENT_AMBULATORY_CARE_PROVIDER_SITE_OTHER): Payer: Managed Care, Other (non HMO) | Admitting: Obstetrics and Gynecology

## 2015-04-27 ENCOUNTER — Encounter: Payer: Self-pay | Admitting: Obstetrics and Gynecology

## 2015-04-27 ENCOUNTER — Ambulatory Visit: Payer: Managed Care, Other (non HMO) | Admitting: Obstetrics and Gynecology

## 2015-04-27 VITALS — BP 140/80 | HR 100 | Resp 22 | Ht 65.0 in | Wt 214.0 lb

## 2015-04-27 DIAGNOSIS — Z01419 Encounter for gynecological examination (general) (routine) without abnormal findings: Secondary | ICD-10-CM | POA: Diagnosis not present

## 2015-04-27 DIAGNOSIS — Z0000 Encounter for general adult medical examination without abnormal findings: Secondary | ICD-10-CM | POA: Diagnosis not present

## 2015-04-27 LAB — POCT URINALYSIS DIPSTICK

## 2015-04-27 NOTE — Patient Instructions (Signed)

## 2015-04-27 NOTE — Progress Notes (Signed)
Patient ID: Deborah Chase, female   DOB: 21-Jan-1956, 59 y.o.   MRN: [id number] 59 y.o. [id number] Married Caucasian female here for annual exam.    No bleeding or spotting.  No significant hot flashes.   Saw dermatology last year.  Given an ointment.  No follow up planned.  Lesions improved if uses medication.   Left shoulder fracture 6 weeks ago after falling.  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 Went to [employer]. Family doing well.  PCP:   Durward Mallard, MD  Patient's last menstrual period was 02/21/2012 (exact date).          Sexually active: No. female The current method of family planning is postmenopausal.   Exercising: No.   Smoker:  no  Health Maintenance: Pap:  03-23-13 Neg:Neg HR HPV History of abnormal Pap:  no MMG:  06-09-14 Benign type calcifications bilaterally.  No evidence of malignancy/no change. Rec.screening in 1year/BiR[facility] 2:[facility] Colonoscopy:  07-14-13 diverticulosis;otherwise normal with Dr. Evette Cristal in [place].  Next due 2024. BMD:   2013  Result  normal TDaP:  PCP Screening Labs:  Hb today: PCP, Urine today: Neg   reports that she has never smoked. She has never used smokeless tobacco. She reports that she does not drink alcohol or use illicit drugs.  Past Medical History  Diagnosis Date  . Depression   . Hypertension     controlled with metoprolol  . Headache(784.0)   . Anxiety   . Allergy   . Diffuse cystic mastopathy 2013  . Hypothyroidism 2011  . Special screening for malignant neoplasms, colon 2012  . Obesity, unspecified 2013  . GERD (gastroesophageal reflux disease) 2009    otc medicine  . Pneumonia 2011  . Abnormal uterine bleeding 03-2012    PMB/ Hysteroscopic Polyp resection  . Shoulder fracture, left     Past Surgical History  Procedure Laterality Date  . Nasal sinus surgery  1988  . Cesarean section  1994  . External ear surgery  2005    ? type  .  Tracheostomy  2011  . Tonsillectomy    . 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 Breast biopsy  2004    fibrosis  . Colonoscopy  [id number]    Dr. Evette Cristal  . Hysteroscopy  03/2012    resection of endometrial polyps  . Dilation and curettage of uterus      Current Outpatient Prescriptions  Medication Sig Dispense Refill  . ARIPiprazole (ABILIFY) 10 MG tablet Take 10 mg by mouth daily.    Marland Kitchen buPROPion (WELLBUTRIN XL) 300 MG 24 hr tablet Take 300 mg by mouth at bedtime.    . DULoxetine (CYMBALTA) 20 MG capsule Take 20 mg by mouth daily. Takes 1.5 tablets at night.    . DULoxetine (CYMBALTA) 60 MG capsule Take 1 capsule by mouth daily. Take in AM  3  . levothyroxine (SYNTHROID, LEVOTHROID) 25 MCG tablet Take 37.5 mcg by mouth daily.    . metoprolol tartrate (LOPRESSOR) 25 MG tablet Take 25 mg by mouth 2 (two) times daily.    Marland Kitchen omeprazole (PRILOSEC) 40 MG capsule Take 40 mg by mouth daily.     No current facility-administered medications for this visit.    Family History  Problem Relation Age of Onset  . Diabetes Brother   . Crohn's disease Mother   . Hypertension Mother   .  Hypertension Father   . Diabetes Brother     ROS:  Pertinent items are noted in HPI.  Otherwise, a comprehensive ROS was negative.  Exam:   BP 140/80 mmHg  Pulse 100  Resp 22  Ht  (1.651 m)  Wt 214 lb (97.07 kg)  BMI 35.61 kg/m2  LMP 02/21/2012 (Exact Date)    General appearance: alert, cooperative and appears stated age Head: Normocephalic, without obvious abnormality, atraumatic Neck: no adenopathy, supple, symmetrical, trachea midline and thyroid normal to inspection and palpation Lungs: clear to auscultation bilaterally Breasts: normal appearance, no masses or tenderness, Inspection negative, No nipple retraction or dimpling, No nipple discharge or bleeding, No axillary or supraclavicular adenopathy Heart: regular rate and rhythm Abdomen: soft,  non-tender; bowel sounds normal; no masses,  no organomegaly Extremities: extremities normal, atraumatic, no cyanosis or edema Skin: Skin color, texture, turgor normal.  Scattered open sores on skin. Lymph nodes: Cervical, supraclavicular, and axillary nodes normal. No abnormal inguinal nodes palpated Neurologic: Grossly normal  Pelvic: External genitalia:  no lesions              Urethra:  normal appearing urethra with no masses, tenderness or lesions              Bartholins and Skenes: normal                 Vagina: normal appearing vagina with normal color and discharge, no lesions              Cervix: no lesions              Pap taken: No. Bimanual Exam:  Uterus:  normal size, contour, position, consistency, mobility, non-tender              Adnexa: normal adnexa and no mass, fullness, tenderness              Rectovaginal: Yes.  .  Confirms.              Anus:  normal sphincter tone, no lesions  Chaperone was present for exam.  Assessment:   Well woman visit with normal exam. Hx of ductal hyperplasia of the breast. Skin lesions.  Established with dermatology.   Plan: Yearly mammogram recommended after age 61. I discussed 3D. Patient [facility]  Phone number given. Recommended self breast exam.  Pap and HR HPV as above. Discussed Calcium, Vitamin D, regular exercise program including cardiovascular and weight bearing exercise. Labs performed.  No..   See orders. Refills given on medications.  No..  See orders. I encouraged a follow up visit with dermatology.  Follow up annually and prn.     After visit summary provided.

## 2015-06-01 ENCOUNTER — Other Ambulatory Visit: Payer: Self-pay

## 2015-06-01 DIAGNOSIS — Z1231 Encounter for screening mammogram for malignant neoplasm of breast: Secondary | ICD-10-CM

## 2015-06-13 ENCOUNTER — Other Ambulatory Visit: Payer: Self-pay | Admitting: General Surgery

## 2015-06-13 ENCOUNTER — Ambulatory Visit
Admission: RE | Admit: 2015-06-13 | Discharge: 2015-06-13 | Disposition: A | Discharge status: Home / Self Care | Payer: Managed Care, Other (non HMO) | Source: Ambulatory Visit | Attending: General Surgery | Admitting: General Surgery

## 2015-06-13 DIAGNOSIS — Z1231 Encounter for screening mammogram for malignant neoplasm of breast: Secondary | ICD-10-CM | POA: Diagnosis present

## 2015-06-21 ENCOUNTER — Ambulatory Visit (INDEPENDENT_AMBULATORY_CARE_PROVIDER_SITE_OTHER): Payer: Managed Care, Other (non HMO) | Admitting: General Surgery

## 2015-06-21 ENCOUNTER — Encounter: Payer: Self-pay | Admitting: General Surgery

## 2015-06-21 VITALS — BP 130/70 | HR 78 | Resp 12 | Ht 65.0 in | Wt 220.0 lb

## 2015-06-21 DIAGNOSIS — N62 Hypertrophy of breast: Secondary | ICD-10-CM | POA: Diagnosis not present

## 2015-06-21 DIAGNOSIS — N6099 Unspecified benign mammary dysplasia of unspecified breast: Secondary | ICD-10-CM

## 2015-06-21 DIAGNOSIS — N6012 Diffuse cystic mastopathy of left breast: Secondary | ICD-10-CM | POA: Diagnosis not present

## 2015-06-21 NOTE — Patient Instructions (Signed)
Continue self breast exams. Call office for any new breast issues or concerns. Screening mammogram in one year.

## 2015-06-21 NOTE — Progress Notes (Signed)
Patient ID: Deborah Chase, female   DOB: 02/22/1956, 59 y.o.   MRN: [id number]  Chief Complaint  Patient presents with  . Follow-up    mammogram    HPI Deborah Chase is a 59 y.o. female who presents for a breast evaluation. The most recent mammogram was done on 06/13/15 .  Patient does perform regular self breast checks and gets regular mammograms done.   I have reviewed the history of present illness with the patient. HPI  Past Medical History  Diagnosis Date  . Depression   . Hypertension     controlled with metoprolol  . Headache(784.0)   . Anxiety   . Allergy   . Diffuse cystic mastopathy 2013  . Hypothyroidism 2011  . Special screening for malignant neoplasms, colon 2012  . Obesity, unspecified 2013  . GERD (gastroesophageal reflux disease) 2009    otc medicine  . Pneumonia 2011  . Abnormal uterine bleeding 03-2012    PMB/ Hysteroscopic Polyp resection  . Shoulder fracture, left     Past Surgical History  Procedure Laterality Date  . Nasal sinus surgery  1988  . Cesarean section  1994  . External ear surgery  2005    ? type  . Tracheostomy  2011  . Tonsillectomy    . 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 Colonoscopy  [id number]    Dr. Evette Cristal  . Hysteroscopy  03/2012    resection of endometrial polyps  . Dilation and curettage of uterus    . Breast biopsy Left 2004    fibrosis    Family History  Problem Relation Age of Onset  . Diabetes Brother   . Crohn's disease Mother   . Hypertension Mother   . Hypertension Father   . Diabetes Brother     Social History Social History  Substance Use Topics  . Smoking status: Never Smoker   . Smokeless tobacco: Never Used  . Alcohol Use: No    Allergies  Allergen Reactions  . Sulfa Antibiotics Swelling and Rash    Bumps in mouth    Current Outpatient Prescriptions  Medication Sig Dispense Refill  . ARIPiprazole (ABILIFY) 10 MG tablet Take 10 mg by mouth  daily.    Marland Kitchen buPROPion (WELLBUTRIN XL) 300 MG 24 hr tablet Take 300 mg by mouth at bedtime.    . DULoxetine (CYMBALTA) 20 MG capsule Take 20 mg by mouth daily. Takes 1.5 tablets at night.    . DULoxetine (CYMBALTA) 60 MG capsule Take 1 capsule by mouth daily. Take in AM  3  . levothyroxine (SYNTHROID, LEVOTHROID) 25 MCG tablet Take 37.5 mcg by mouth daily.    . metoprolol tartrate (LOPRESSOR) 25 MG tablet Take 25 mg by mouth 2 (two) times daily.    Marland Kitchen omeprazole (PRILOSEC) 40 MG capsule Take 40 mg by mouth daily.     No current facility-administered medications for this visit.    Review of Systems Review of Systems  Constitutional: Negative.   Respiratory: Negative.   Cardiovascular: Negative.     Blood pressure 130/70, pulse 78, resp. rate 12, height  (1.651 m), weight 220 lb (99.791 kg), last menstrual period 02/21/2012.  Physical Exam Physical Exam  Constitutional: She is oriented to person, place, and time. She appears well-developed and well-nourished.  Eyes: Conjunctivae are normal. No scleral icterus.  Neck: Neck supple.  Cardiovascular: Normal rate, regular rhythm and normal heart sounds.   Pulmonary/Chest: Effort normal  and breath sounds normal. Right breast exhibits no inverted nipple, no mass, no nipple discharge, no skin change and no tenderness. Left breast exhibits no inverted nipple, no mass, no nipple discharge, no skin change and no tenderness.  Abdominal: Soft. There is no hepatomegaly. There is no tenderness.  Lymphadenopathy:    She has no cervical adenopathy.    She has no axillary adenopathy.  Neurological: She is alert and oriented to person, place, and time.  Skin: Skin is warm and dry.    Data Reviewed Mammogram reviewed and stable.  Assessment    Stable exam. FCD and ductal hyperplasia with no atypia.     Plan        Patient will be asked to return to the office in one year with a bilateral screening mammogram.  PCP:   Tilda FrancoPisharody SANKAR,SEEPLAPUTHUR G 06/21/2015, 9:13 AM

## 2016-01-26 ENCOUNTER — Ambulatory Visit
Admission: RE | Admit: 2016-01-26 | Discharge: 2016-01-26 | Disposition: A | Discharge status: Home / Self Care | Payer: Managed Care, Other (non HMO) | Source: Ambulatory Visit | Attending: Internal Medicine | Admitting: Internal Medicine

## 2016-01-26 ENCOUNTER — Other Ambulatory Visit: Payer: Self-pay | Admitting: Internal Medicine

## 2016-01-26 DIAGNOSIS — R059 Cough, unspecified: Secondary | ICD-10-CM

## 2016-01-26 DIAGNOSIS — R05 Cough: Secondary | ICD-10-CM

## 2016-03-14 ENCOUNTER — Ambulatory Visit: Payer: Managed Care, Other (non HMO) | Admitting: Neurology

## 2016-03-30 ENCOUNTER — Ambulatory Visit (INDEPENDENT_AMBULATORY_CARE_PROVIDER_SITE_OTHER): Payer: Managed Care, Other (non HMO) | Admitting: Neurology

## 2016-03-30 ENCOUNTER — Encounter: Payer: Self-pay | Admitting: Neurology

## 2016-03-30 VITALS — BP 140/70 | HR 84 | Ht 65.0 in | Wt 224.5 lb

## 2016-03-30 DIAGNOSIS — G5712 Meralgia paresthetica, left lower limb: Secondary | ICD-10-CM | POA: Diagnosis not present

## 2016-03-30 DIAGNOSIS — E669 Obesity, unspecified: Secondary | ICD-10-CM

## 2016-03-30 DIAGNOSIS — G5710 Meralgia paresthetica, unspecified lower limb: Secondary | ICD-10-CM | POA: Insufficient documentation

## 2016-03-30 NOTE — Progress Notes (Signed)
[facility] Clinic Note - Initial Visit   Date: 03/30/16  Deborah Chase MRN: [id number] DOB: Jan 08, 1956   Dear Dr. Constance Goltz:  Thank you for your kind referral of Deborah Chase for consultation of left leg weakness. Although her history is well known to you, please allow [place] to reiterate it for the purpose of our medical record. The patient was accompanied to the clinic by self.   History of Present Illness: Deborah Chase is a 60 y.o. right-handed Caucasian female with GERD and depression presenting for evaluation of left leg numbness.    Starting around February 2016, she began experiencing numbness and burning sensation, which is localized over the left lateral thigh and does not radiate into the medial thigh or past the knee.  Symptoms are intermittent and worse with prolonged standing. It usually lasts about 3-4 hours. She occasionally feels weakness in her legs.   She also has chronic low back pain and does not treat this.  She was previously seeing a [facility].  She occasionally wears a belt, but not often. She has gained 30lb over the past year.    Out-side paper records, electronic medical record, and images have been reviewed where available and summarized as:  Labs 01/26/2016:  TSH 3.44, vitamin D 30, HCV neg, CBC and CMP normal  Past Medical History:  Diagnosis Date  . Abnormal uterine bleeding 03-2012   PMB/ Hysteroscopic Polyp resection  . Allergy   . Anxiety   . Depression   . Diffuse cystic mastopathy 2013  . GERD (gastroesophageal reflux disease) 2009   otc medicine  . Headache(784.0)   . Hypertension    controlled with metoprolol  . Hypothyroidism 2011  . Obesity, unspecified 2013  . Pneumonia 2011  . Shoulder fracture, left   . Special screening for malignant neoplasms, colon 2012    Past Surgical History:  Procedure Laterality Date  . BREAST BIOPSY Left 2004   fibrosis  . BREAST SURGERY Right 06-2007   Right breast biopsy/  ductal hyperplasia without atypia  . CESAREAN SECTION  1994  . COLONOSCOPY  4098,1191   Dr. Evette Cristal  . DILATION AND CURETTAGE OF UTERUS    . EXTERNAL EAR SURGERY  2005   ? type  . HYSTEROSCOPY  03/2012   resection of endometrial polyps  . NASAL SINUS SURGERY  1988  . TONSILLECTOMY    . TRACHEOSTOMY  2011     Medications:  Outpatient Encounter Prescriptions as of 03/30/2016  Medication Sig Note  . Brexpiprazole (REXULTI) 2 MG TABS Take by mouth.   Marland Kitchen buPROPion (WELLBUTRIN SR) 200 MG 12 hr tablet Take 200 mg by mouth 2 (two) times daily.   . DULoxetine (CYMBALTA) 30 MG capsule Take 30 mg by mouth daily.   . DULoxetine (CYMBALTA) 60 MG capsule Take 1 capsule by mouth daily. Take in AM 04/27/2015: Received from: External Pharmacy Received Sig: TK 1 C PO Q MORNING .  Marland Kitchen omeprazole (PRILOSEC) 40 MG capsule Take 40 mg by mouth daily.   . [DISCONTINUED] Brexpiprazole (REXULTI) 1 MG TABS Take by mouth.   . [DISCONTINUED] metoprolol tartrate (LOPRESSOR) 25 MG tablet Take 25 mg by mouth 2 (two) times daily.   . [DISCONTINUED] ARIPiprazole (ABILIFY) 10 MG tablet Take 10 mg by mouth daily.   . [DISCONTINUED] benzonatate (TESSALON) 100 MG capsule  03/30/2016: Received from: External Pharmacy  . [DISCONTINUED] buPROPion (WELLBUTRIN SR) 200 MG 12 hr tablet TK 1 T PO BID 03/30/2016: Received from: External  Pharmacy  . [DISCONTINUED] buPROPion (WELLBUTRIN XL) 300 MG 24 hr tablet Take 200 mg by mouth at bedtime.    . [DISCONTINUED] DULoxetine (CYMBALTA) 20 MG capsule Take 20 mg by mouth daily. Takes 1.5 tablets at night.   . [DISCONTINUED] DULoxetine (CYMBALTA) 30 MG capsule TK ONE C PO QD 03/30/2016: Received from: External Pharmacy  . [DISCONTINUED] levothyroxine (SYNTHROID, LEVOTHROID) 25 MCG tablet Take 37.5 mcg by mouth daily.    No facility-administered encounter medications on file as of 03/30/2016.      Allergies:  Allergies  Allergen Reactions  . Sulfa Antibiotics Swelling and Rash    Bumps in  mouth    Family History: Family History  Problem Relation Age of Onset  . Diabetes Brother   . Crohn's disease Mother   . Hypertension Mother   . Hypertension Father   . Diabetes Brother   . Healthy Daughter     Social History: Social History  Substance Use Topics  . Smoking status: Never Smoker  . Smokeless tobacco: Never Used  . Alcohol use No   Social History   Social History Narrative   Lives with husband and daughter in a 2 story home.  Works as a Designer, industrial/productlab tech.  Education: associates degree.      Review of Systems:  CONSTITUTIONAL: No fevers, chills, night sweats, or weight loss.   EYES: No visual changes or eye pain ENT: No hearing changes.  No history of nose bleeds.   RESPIRATORY: No cough, wheezing and shortness of breath.   CARDIOVASCULAR: Negative for chest pain, and palpitations.   GI: Negative for abdominal discomfort, blood in stools or black stools.  No recent change in bowel habits.   GU:  No history of incontinence.   MUSCLOSKELETAL: No history of joint pain or swelling.  No myalgias.   SKIN: Negative for lesions, rash, and itching.   HEMATOLOGY/ONCOLOGY: Negative for prolonged bleeding, bruising easily, and swollen nodes.  No history of cancer.   ENDOCRINE: Negative for cold or heat intolerance, polydipsia or goiter.   PSYCH:  +depression or anxiety symptoms.   NEURO: As Above.   Vital Signs:  BP 140/70   Pulse 84   Ht 5\' 5"  (1.651 m)   Wt 224 lb 8 oz (101.8 kg)   LMP 02/21/2012 (Exact Date)   SpO2 98%   BMI 37.36 kg/m    General Medical Exam:   General:  Well appearing, comfortable.   Eyes/ENT: see cranial nerve examination.   Neck: No masses appreciated.  Full range of motion without tenderness.  No carotid bruits. Respiratory:  Clear to auscultation, good air entry bilaterally.   Cardiac:  Regular rate and rhythm, no murmur.   Extremities:  No deformities, edema, or skin discoloration.  Skin:  No rashes or lesions.  Neurological  Exam: MENTAL STATUS including orientation to time, place, person, recent and remote memory, attention span and concentration, language, and fund of knowledge is normal.  Speech is not dysarthric.  CRANIAL NERVES: II:  No visual field defects.  Unremarkable fundi.   III-IV-VI: Pupils equal round and reactive to light.  Normal conjugate, extra-ocular eye movements in all directions of gaze.  No nystagmus.  No ptosis.   V:  Normal facial sensation.   VII:  Normal facial symmetry and movements.  VIII:  Normal hearing and vestibular function.   IX-X:  Normal palatal movement.   XI:  Normal shoulder shrug and head rotation.   XII:  Normal tongue strength and range of motion,  no deviation or fasciculation.  MOTOR: Motor strength is 5/5 throughout.  No atrophy, fasciculations or abnormal movements.  No pronator drift.  Tone is normal.    MSRs: Brisk and symmetric reflexes throughout 2+/4, except 1+/4 at the ankles bilaterally.  Plantar responses are downgoing  SENSORY:  Pin prick is reduced over the left anterolateral thigh, otherwise, normal and symmetric perception of light touch, pinprick, and vibration.   COORDINATION/GAIT: Normal finger-to- nose-finger. Intact rapid alternating movements bilaterally.  Able to rise from a chair without using arms.  Gait is wide-based due to body habitus, slow, stable and unassisted. She is able to perform stressed gait, but unsteady with tandem gait.   IMPRESSION: Ms. Zeiter is a 60 year-old female referred for evaluation of left thigh paresthesias.  Her examination is consistent with diminished sensation over the anterolateral thigh on the left side. 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 Management is conservative with NSAIDs and avoidance of repetitive trauma to this region.  Symptoms are not bothersome enough for her to start medications such as NSAIDs  or gabapentin.  The most likely etiology is her 30lb weight gain which can apply localized compression to the nerve and I stressed the importance of weight loss to prevent permanent deficits.   Return to clinic as needed  The duration of this appointment visit was 40 minutes of face-to-face time with the patient.  Greater than 50% of this time was spent in counseling, explanation of diagnosis, planning of further management, and coordination of care.   Thank you for allowing me to participate in patient's care.  If I can answer any additional questions, I would be pleased to do so.    Sincerely,    Terin Cragle K. Allena Katz, DO

## 2016-03-30 NOTE — Patient Instructions (Addendum)
You have meralgia paresthetica which is an entrapment of the lateral femoral cutaneous nerve as it exits below the pelvis.    Goal is weight loss to prevent ongoing injury to the nerve. If your pain becomes severe, please call my office and we can send a medication to help with the burning sensation

## 2016-04-26 ENCOUNTER — Other Ambulatory Visit: Payer: Self-pay | Admitting: General Surgery

## 2016-04-26 DIAGNOSIS — Z1231 Encounter for screening mammogram for malignant neoplasm of breast: Secondary | ICD-10-CM

## 2016-05-09 NOTE — Progress Notes (Signed)
60 y.o. G75P1041 Married Caucasian female here for annual exam.    No vaginal bleeding.  No vaginal dryness.  Having trouble with her tongue.  Feels almost like it is burned but this did not occur. Not sure what is going on.  Mouth is dry.   Biotene no help.  No difficulty swallowing.  Not coughing up blood.  Not a smoker.   Taking vit D daily on her own.  Takes MVI daily.   PCP: Raechel Chute, MD  Patient's last menstrual period was 02/21/2012 (exact date).           Sexually active: No. female The current method of family planning is post menopausal status.    Exercising: Yes.    Pt.works out at the gym 3 days/week. Smoker:  no  Health Maintenance: Pap:  03-23-13 Neg:Neg HR HPV History of abnormal Pap:  no MMG:  06-13-15 3D/Density C/Neg/BiRads1:[facility] Colonoscopy: 07-14-13 diverticulosis;otherwise normal with Dr. Evette Cristal in [place].  Next due 2024.  BMD:   2013  Result  normal TDaP:  PCP Gardasil:   N/A HIV: Had life insurance physical 2 weeks ago so likely done then.  Hep C: neg - PCP. Screening Labs:  Hb today: PCP, Urine today: unable to void   reports that she has never smoked. She has never used smokeless tobacco. She reports that she does not drink alcohol or use drugs.  Past Medical History:  Diagnosis Date  . Abnormal uterine bleeding 03-2012   PMB/ Hysteroscopic Polyp resection  . Allergy   . Anxiety   . Depression   . Diffuse cystic mastopathy 2013  . GERD (gastroesophageal reflux disease) 2009   otc medicine  . Headache(784.0)   . Hypertension    controlled with metoprolol  . Hypothyroidism 2011  . Obesity, unspecified 2013  . Pneumonia 2011  . Shoulder fracture, left   . Special screening for malignant neoplasms, colon 2012    Past Surgical History:  Procedure Laterality Date  . BREAST BIOPSY Left 2004   fibrosis  . BREAST SURGERY Right 06-2007   Right breast biopsy/ ductal hyperplasia without atypia  . CESAREAN SECTION  1994  .  COLONOSCOPY  [id number]   Dr. Evette Cristal  . DILATION AND CURETTAGE OF UTERUS    . EXTERNAL EAR SURGERY  2005   ? type  . HYSTEROSCOPY  03/2012   resection of endometrial polyps  . NASAL SINUS SURGERY  1988  . TONSILLECTOMY    . TRACHEOSTOMY  2011    Current Outpatient Prescriptions  Medication Sig Dispense Refill  . buPROPion (WELLBUTRIN SR) 200 MG 12 hr tablet Take 200 mg by mouth 2 (two) times daily.    . DULoxetine (CYMBALTA) 30 MG capsule Take 30 mg by mouth daily.    . DULoxetine (CYMBALTA) 60 MG capsule Take 1 capsule by mouth daily. Take in AM  3  . omeprazole (PRILOSEC) 40 MG capsule Take 40 mg by mouth daily.     No current facility-administered medications for this visit.     Family History  Problem Relation Age of Onset  . Diabetes Brother   . Crohn's disease Mother   . Hypertension Mother   . Hypertension Father   . Diabetes Brother   . Healthy Daughter     ROS:  Pertinent items are noted in HPI.  Otherwise, a comprehensive ROS was negative.  Exam:   BP 116/64 (BP Location: Right Arm, Patient Position: Sitting, Cuff Size: Large)   Pulse 84   Resp 18  Ht 5' 5.5" (1.664 m)   Wt 214 lb 12.8 oz (97.4 kg)   LMP 02/21/2012 (Exact Date)   BMI 35.20 kg/m     General appearance: alert, cooperative and appears stated age Head: Normocephalic, without obvious abnormality, atraumatic Neck: no adenopathy, supple, symmetrical, trachea midline and thyroid normal to inspection and palpation Lungs: clear to auscultation bilaterally Breasts: normal appearance, no masses or tenderness, No nipple retraction or dimpling, No nipple discharge or bleeding, No axillary or supraclavicular adenopathy Heart: regular rate and rhythm Abdomen: soft, non-tender; no masses, no organomegaly Extremities: extremities normal, atraumatic, no cyanosis or edema Skin:  Multiple eschars on skin and hypopigmented areas consistent with scarring.  Lymph nodes: Cervical, supraclavicular, and axillary  nodes normal. No abnormal inguinal nodes palpated Neurologic: Grossly normal  Pelvic: External genitalia:  no lesions              Urethra:  normal appearing urethra with no masses, tenderness or lesions              Bartholins and Skenes: normal                 Vagina: normal appearing vagina with normal color and discharge, no lesions              Cervix: no lesions              Pap taken: Yes.   Bimanual Exam:  Uterus:  normal size, contour, position, consistency, mobility, non-tender              Adnexa: no mass, fullness, tenderness              Rectal exam: Yes.  .  Confirms.              Anus:  normal sphincter tone, no lesions  Chaperone was present for exam.  Assessment:   Well woman visit with normal exam. Hx of ductal hyperplasia of the breast. Skin lesions.  [facility]abl[facility]  Dry mouth.   Depression.  Receiving treatment.   Plan: Yearly mammogram recommended after age 60.  Recommended self breast exam.  Pap and HR HPV as above. Discussed Calcium, Vitamin D, regular exercise program including cardiovascular and weight bearing exercise. Return to PCP if mouth and tongue symptoms persist. Follow up annually and prn.     After visit summary provided.

## 2016-05-10 ENCOUNTER — Encounter: Payer: Self-pay | Admitting: Obstetrics and Gynecology

## 2016-05-10 ENCOUNTER — Ambulatory Visit (INDEPENDENT_AMBULATORY_CARE_PROVIDER_SITE_OTHER): Payer: Managed Care, Other (non HMO) | Admitting: Obstetrics and Gynecology

## 2016-05-10 VITALS — BP 116/64 | HR 84 | Resp 18 | Ht 65.5 in | Wt 214.8 lb

## 2016-05-10 DIAGNOSIS — Z01419 Encounter for gynecological examination (general) (routine) without abnormal findings: Secondary | ICD-10-CM

## 2016-05-10 NOTE — Patient Instructions (Signed)

## 2016-05-14 LAB — IPS PAP TEST WITH HPV

## 2016-05-15 ENCOUNTER — Encounter: Payer: Self-pay | Admitting: *Deleted

## 2016-06-05 ENCOUNTER — Other Ambulatory Visit: Payer: Self-pay | Admitting: Internal Medicine

## 2016-06-05 DIAGNOSIS — H538 Other visual disturbances: Secondary | ICD-10-CM

## 2016-06-06 ENCOUNTER — Ambulatory Visit
Admission: RE | Admit: 2016-06-06 | Discharge: 2016-06-06 | Disposition: A | Discharge status: Home / Self Care | Payer: Managed Care, Other (non HMO) | Source: Ambulatory Visit | Attending: Internal Medicine | Admitting: Internal Medicine

## 2016-06-06 DIAGNOSIS — H538 Other visual disturbances: Secondary | ICD-10-CM

## 2016-06-06 MED ORDER — IOPAMIDOL (ISOVUE-300) INJECTION 61%
60.0000 mL | Freq: Once | INTRAVENOUS | Status: AC | PRN
Start: 2016-06-06 — End: 2016-06-06
  Administered 2016-06-06: 60 mL via INTRAVENOUS

## 2016-06-18 ENCOUNTER — Ambulatory Visit
Admission: RE | Admit: 2016-06-18 | Discharge: 2016-06-18 | Disposition: A | Discharge status: Home / Self Care | Payer: Managed Care, Other (non HMO) | Source: Ambulatory Visit | Attending: General Surgery | Admitting: General Surgery

## 2016-06-18 ENCOUNTER — Encounter: Payer: Self-pay | Admitting: *Deleted

## 2016-06-18 DIAGNOSIS — Z1231 Encounter for screening mammogram for malignant neoplasm of breast: Secondary | ICD-10-CM | POA: Diagnosis present

## 2016-06-25 ENCOUNTER — Encounter: Payer: Self-pay | Admitting: General Surgery

## 2016-06-25 ENCOUNTER — Ambulatory Visit (INDEPENDENT_AMBULATORY_CARE_PROVIDER_SITE_OTHER): Payer: Managed Care, Other (non HMO) | Admitting: General Surgery

## 2016-06-25 VITALS — BP 130/74 | HR 74 | Resp 12 | Ht 66.0 in | Wt 211.0 lb

## 2016-06-25 DIAGNOSIS — N6099 Unspecified benign mammary dysplasia of unspecified breast: Secondary | ICD-10-CM

## 2016-06-25 DIAGNOSIS — N6012 Diffuse cystic mastopathy of left breast: Secondary | ICD-10-CM | POA: Diagnosis not present

## 2016-06-25 NOTE — Patient Instructions (Signed)
The patient has been asked to return to the office in one year with a bilateral screening mammogram. 

## 2016-06-25 NOTE — Progress Notes (Signed)
Patient ID: Deborah Chase, female   DOB: 06/09/1956, 60 y.o.   MRN: [id number]  Chief Complaint  Patient presents with  . Follow-up    mammogram    HPI Deborah Houstonvie B Lisenbee is a 60 y.o. female who presents for a breast evaluation. The most recent mammogram was done on 06/18/16. Patient does perform regular self breast checks and gets regular mammograms done.   I have reviewed the history of present illness with the patient.  HPI  Past Medical History:  Diagnosis Date  . Abnormal uterine bleeding 03-2012   PMB/ Hysteroscopic Polyp resection  . Allergy   . Anxiety   . Depression   . Diffuse cystic mastopathy 2013  . GERD (gastroesophageal reflux disease) 2009   otc medicine  . Headache(784.0)   . Hypertension    controlled with metoprolol  . Hypothyroidism 2011  . Obesity, unspecified 2013  . Pneumonia 2011  . Shoulder fracture, left   . Special screening for malignant neoplasms, colon 2012    Past Surgical History:  Procedure Laterality Date  . BREAST BIOPSY Left 2004   fibrosis  . BREAST CYST ASPIRATION Bilateral   . BREAST SURGERY Right 06-2007   Right breast biopsy/ ductal hyperplasia without atypia  . CESAREAN SECTION  1994  . COLONOSCOPY  [id number]   Dr. Evette CristalSankar  . DILATION AND CURETTAGE OF UTERUS    . EXTERNAL EAR SURGERY  2005   ? type  . HYSTEROSCOPY  03/2012   resection of endometrial polyps  . NASAL SINUS SURGERY  1988  . TONSILLECTOMY    . TRACHEOSTOMY  2011    Family History  Problem Relation Age of Onset  . Diabetes Brother   . Crohn's disease Mother   . Hypertension Mother   . Hypertension Father   . Diabetes Brother   . Healthy Daughter     Social History Social History  Substance Use Topics  . Smoking status: Never Smoker  . Smokeless tobacco: Never Used  . Alcohol use No    Allergies  Allergen Reactions  . Sulfa Antibiotics Swelling and Rash    Bumps in mouth    Current Outpatient Prescriptions  Medication Sig Dispense Refill  .  buPROPion (WELLBUTRIN SR) 200 MG 12 hr tablet Take 200 mg by mouth 2 (two) times daily.    . DULoxetine (CYMBALTA) 30 MG capsule Take 30 mg by mouth daily.    . DULoxetine (CYMBALTA) 60 MG capsule Take 1 capsule by mouth daily. Take in AM  3  . omeprazole (PRILOSEC) 40 MG capsule Take 40 mg by mouth daily.     No current facility-administered medications for this visit.     Review of Systems Review of Systems  Blood pressure 130/74, pulse 74, resp. rate 12, height 5\' 6"  (1.676 m), weight 211 lb (95.7 kg), last menstrual period 02/21/2012.  Physical Exam Physical Exam  Constitutional: She is oriented to person, place, and time. She appears well-developed and well-nourished.  Eyes: Conjunctivae are normal. No scleral icterus.  Neck: Neck supple.  Cardiovascular: Normal rate, regular rhythm and normal heart sounds.   Pulmonary/Chest: Effort normal and breath sounds normal. Right breast exhibits no inverted nipple, no mass, no nipple discharge, no skin change and no tenderness. Left breast exhibits no inverted nipple, no mass, no nipple discharge, no skin change and no tenderness.  Abdominal: Soft. Normal appearance and bowel sounds are normal. There is no tenderness.  Lymphadenopathy:    She has no cervical adenopathy.  She has no axillary adenopathy.  Neurological: She is alert and oriented to person, place, and time.  Skin: Skin is warm and dry.    Data Reviewed Mammogram reviewed  Assessment    Stable exam. History of left fibrocystic breast disease.     Plan       The patient has been asked to return to the office in one year with a bilateral screening mammogram.   This information has been scribed by Ples SpecterJessica Qualls CMA.  Ireland Virrueta G 06/25/2016, 2:11 PM

## 2016-07-16 ENCOUNTER — Ambulatory Visit (INDEPENDENT_AMBULATORY_CARE_PROVIDER_SITE_OTHER): Payer: Managed Care, Other (non HMO) | Admitting: Neurology

## 2016-07-16 ENCOUNTER — Encounter: Payer: Self-pay | Admitting: Neurology

## 2016-07-16 VITALS — BP 140/80 | HR 89 | Ht 66.0 in | Wt 212.5 lb

## 2016-07-16 DIAGNOSIS — R51 Headache: Secondary | ICD-10-CM | POA: Diagnosis not present

## 2016-07-16 DIAGNOSIS — G244 Idiopathic orofacial dystonia: Secondary | ICD-10-CM | POA: Diagnosis not present

## 2016-07-16 DIAGNOSIS — H5713 Ocular pain, bilateral: Secondary | ICD-10-CM

## 2016-07-16 DIAGNOSIS — R292 Abnormal reflex: Secondary | ICD-10-CM | POA: Diagnosis not present

## 2016-07-16 DIAGNOSIS — G5139 Clonic hemifacial spasm, unspecified: Secondary | ICD-10-CM

## 2016-07-16 DIAGNOSIS — R519 Headache, unspecified: Secondary | ICD-10-CM

## 2016-07-16 DIAGNOSIS — G513 Clonic hemifacial spasm: Secondary | ICD-10-CM

## 2016-07-16 DIAGNOSIS — G245 Blepharospasm: Secondary | ICD-10-CM

## 2016-07-16 MED ORDER — CYCLOBENZAPRINE HCL 5 MG PO TABS: 2.5000 mg | ORAL_TABLET | Freq: Every evening | ORAL | 2 refills | Status: DC | PRN

## 2016-07-16 NOTE — Progress Notes (Signed)
Note faxed.

## 2016-07-16 NOTE — Progress Notes (Signed)
Follow-up Visit   Date: 07/16/16    Deborah Chase MRN: [id number] DOB: 01/22/1956   Interim History: Deborah Chase is a 60 y.o. right-handed Caucasian female with GERD and depression returning to the clinic with new complaints of facial twitching and eye pain. She was previously seen for left meralgia paresthetica. The patient was accompanied to the clinic by self.  History of present illness: Starting around February 2016, she began experiencing numbness and burning sensation, which is localized over the left lateral thigh and does not radiate into the medial thigh or past the knee.  Symptoms are intermittent and worse with prolonged standing. It usually lasts about 3-4 hours. She occasionally feels weakness in her legs.   She also has chronic low back pain and does not treat this.  She was previously seeing a Landchiropractor.  She occasionally wears a belt. She has gained 30lb over the past year.    UPDATE 07/16/2016:  She reports having facial twitching and frequent blinking which started around September 2017.  This is constant and she cannot control the movements.  She has not noticed any triggers.  She takes Wellbutrin 100mg  BID (reduced from 200mg  BID) and Cymbalta 60mg  daily for depression, which is followed by her psychiatrist.  She was on Abilify for 4-5 years and in the late summer developed hand tremor, for which this was stopped. Her movements started one month after discontinuing abilify.  She has noticed some mild voice changes and denies difficulty with swallowing.  Before her facial movements, she developed a new bifrontal headache which she thought was due to sinus infection, and was treated with antibiotics.  This alleviated the pain, but has change in her facial movements or eye pain.  She also complains of bilateral eye pain, like a toothache.  There is no double vision. Eye pain is constant.  No associated nausea/vomiting.  She endorses photophobia and feels better if her  eyes are closed. She saw optometrist and ophthalmologist whose evaluation was normal. She has tried OTC NSAIDs, but had the greatest relief with Ilevro, opthalmic NSAID drops given by her eye doctor.    She also complains of whole body itching.    Soon after her last visit, she started going to the gym and has lost 12lb which has helped with her left thigh numbness.    Medications:  Current Outpatient Prescriptions on File Prior to Visit  Medication Sig Dispense Refill  . buPROPion (WELLBUTRIN SR) 200 MG 12 hr tablet Take 100 mg by mouth 2 (two) times daily.     . DULoxetine (CYMBALTA) 60 MG capsule Take 1 capsule by mouth daily. Take in AM  3  . omeprazole (PRILOSEC) 40 MG capsule Take 40 mg by mouth daily.     No current facility-administered medications on file prior to visit.     Allergies:  Allergies  Allergen Reactions  . Sulfa Antibiotics Swelling and Rash    Bumps in mouth    Review of Systems:  CONSTITUTIONAL: No fevers, chills, night sweats, or weight loss.  EYES: No visual changes +eye pain ENT: No hearing changes.  No history of nose bleeds.   RESPIRATORY: No cough, wheezing and shortness of breath.   CARDIOVASCULAR: Negative for chest pain, and palpitations.   GI: Negative for abdominal discomfort, blood in stools or black stools.  No recent change in bowel habits.   GU:  No history of incontinence.   MUSCLOSKELETAL: No history of joint pain or swelling.  No  myalgias.   SKIN: Negative for lesions, rash, and itching.   ENDOCRINE: Negative for cold or heat intolerance, polydipsia or goiter.   PSYCH:  + depression or anxiety symptoms.   NEURO: As Above.   Vital Signs:  BP 140/80   Pulse 89   Ht 5\' 6"  (1.676 m)   Wt 212 lb 8 oz (96.4 kg)   LMP 02/21/2012 (Exact Date)   SpO2 99%   BMI 34.30 kg/m   Neurological Exam: MENTAL STATUS including orientation to time, place, person, recent and remote memory, attention span and concentration, language, and fund of  knowledge is normal.  Speech is not dysarthric.  CRANIAL NERVES: No visual field defects.  Pupils equal round and reactive to light.  Normal conjugate, extra-ocular eye movements in all directions of gaze.  No ptosis. Normal facial sensation.  Face is symmetric and motor strength is 5/5. There is intermittent bilateral blepharospasm as well as arrhythmical movement of the angles of the mouth and less commonly pursing of the lips.  Palate elevates symmetrically.  Tongue is midline.  MOTOR:  Motor strength is 5/5 in all extremities.  No pronator drift.  Tone is normal.    MSRs:  Reflexes are 3+/4 throughout, except 1+ Achilles bilaterally.  COORDINATION/GAIT:  Normal finger-to- nose-finger and heel-to-shin.  Intact rapid alternating movements bilaterally.  Gait shows reduced arm swing bilaterally, otherwise narrow based and stable.    IMPRESSION/PLAN: 1.  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  Abilify has been discontinued for 5 months and ideally, it is best to observe patients for at least 6 months after stopping a antipsychotic medication before exclude medication side effects.  Rare cases of Wellbutrin have been shown to have tardive dyskinesia and would recommend a trial off this medication, if able. Her depression management is followed by Dr. Evelene CroonKaur. She is on low dose of Wellbutrin 100mg  BID, so my overall suspicion that her medication is causing side effects is very low.    2.  New onset headaches with periorbital pain. MRI brain wwo contrast will be ordered to look for intracranial pathology to explain her headaches and abnormal facial movements.  In the meantime, I will treat her with flexeril 5mg  at bedtime to see if this helps her headaches and abnormal movements.   3.  Left meralgia paresthetica.  I praised the patient for loosing 12lb since  her last visit and encouraged her to stay active.   Going forward, she will most likely see Dr. Arbutus Leasat, our Movement Disorder Specialist, for Botox injections, as this is the most effective treatment for Meige's syndrome.   The duration of this appointment visit was 40 minutes of face-to-face time with the patient.  Greater than 50% of this time was spent in counseling, explanation of diagnosis, planning of further management, and coordination of care.   Thank you for allowing me to participate in patient's care.  If I can answer any additional questions, I would be pleased to do so.    Sincerely,    Jasenia Weilbacher K. Allena KatzPatel, DO

## 2016-07-16 NOTE — Patient Instructions (Addendum)
1.  MRI brain wwo contrast 2.  Start flexeril 5mg  at bedtime for headaches 3.  I would like to see how your movements behave off Wellbutrin, if your psychiatrist is able to taper the medication.   4.  If you movements continue, we will schedule a visit with Dr. Arbutus Leasat for Botox injections.

## 2016-07-18 ENCOUNTER — Telehealth: Payer: Self-pay | Admitting: Neurology

## 2016-07-18 NOTE — Telephone Encounter (Signed)
Deborah GunningEvie [place]  12/13/1955. Her # [phone]. She was seen in our office on Monday 07/16/16. She has not heard from anyone regarding setting up her MRI. I did tell her that the MRI office should be contacting her, but she wanted to follow up wit you. Thank you

## 2016-07-18 NOTE — Telephone Encounter (Signed)
Patient given # to [facility] to call and set up the appointment.

## 2016-07-28 ENCOUNTER — Ambulatory Visit
Admission: RE | Admit: 2016-07-28 | Discharge: 2016-07-28 | Disposition: A | Discharge status: Home / Self Care | Payer: Managed Care, Other (non HMO) | Source: Ambulatory Visit | Attending: Neurology | Admitting: Neurology

## 2016-07-28 DIAGNOSIS — G5139 Clonic hemifacial spasm, unspecified: Secondary | ICD-10-CM

## 2016-07-28 DIAGNOSIS — H5713 Ocular pain, bilateral: Secondary | ICD-10-CM

## 2016-07-28 DIAGNOSIS — R51 Headache: Secondary | ICD-10-CM

## 2016-07-28 DIAGNOSIS — R519 Headache, unspecified: Secondary | ICD-10-CM

## 2016-07-28 DIAGNOSIS — G244 Idiopathic orofacial dystonia: Secondary | ICD-10-CM

## 2016-07-28 MED ORDER — GADOBENATE DIMEGLUMINE 529 MG/ML IV SOLN
20.0000 mL | Freq: Once | INTRAVENOUS | Status: AC | PRN
  Administered 2016-07-28: 20 mL via INTRAVENOUS

## 2016-08-23 ENCOUNTER — Encounter: Payer: Self-pay | Admitting: Neurology

## 2016-08-23 ENCOUNTER — Ambulatory Visit (INDEPENDENT_AMBULATORY_CARE_PROVIDER_SITE_OTHER): Payer: Managed Care, Other (non HMO) | Admitting: Neurology

## 2016-08-23 VITALS — BP 110/70 | HR 94 | Ht 66.0 in | Wt 209.1 lb

## 2016-08-23 DIAGNOSIS — G244 Idiopathic orofacial dystonia: Secondary | ICD-10-CM | POA: Diagnosis not present

## 2016-08-23 DIAGNOSIS — H5713 Ocular pain, bilateral: Secondary | ICD-10-CM | POA: Diagnosis not present

## 2016-08-23 MED ORDER — CLONAZEPAM 0.5 MG PO TABS: 0.2500 mg | ORAL_TABLET | Freq: Two times a day (BID) | ORAL | 0 refills | Status: DC

## 2016-08-23 MED ORDER — CLONAZEPAM 0.5 MG PO TABS: 0.5000 mg | ORAL_TABLET | Freq: Two times a day (BID) | ORAL | 0 refills | Status: DC

## 2016-08-23 NOTE — Progress Notes (Signed)
Follow-up Visit   Date: 08/23/16    Deborah Chase MRN: [id number] DOB: 12/02/1955   Interim History: Deborah Chase is a 61 y.o. 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 The patient was accompanied to the clinic by self.  History of present illness: Starting around February 2016, she began experiencing numbness and burning sensation, which is localized over the left lateral thigh and does not radiate into the medial thigh or past the knee.  Symptoms are intermittent and worse with prolonged standing. It usually lasts about 3-4 hours. She occasionally feels weakness in her legs.   She also has chronic low back pain and does not treat this.  She was previously seeing a Landchiropractor.  She occasionally wears a belt. She has gained 30lb over the past year.    UPDATE 07/16/2016:  She reports having facial twitching and frequent blinking which started around September 2017.  This is constant and she cannot control the movements.  She has not noticed any triggers.  She takes Wellbutrin 100mg  BID (reduced from 200mg  BID) and Cymbalta 60mg  daily for depression, which is followed by her psychiatrist.  She was on Abilify for 4-5 years and in the late summer developed hand tremor, for which this was stopped. Her movements started one month after discontinuing abilify.  She has noticed some mild voice changes and denies difficulty with swallowing.  Before her facial movements, she developed a new bifrontal headache which she thought was due to sinus infection, and was treated with antibiotics.  This alleviated the pain, but has change in her facial movements or eye pain.  She also complains of bilateral eye pain, like a toothache.  There is no double vision. Eye pain is constant.  No associated nausea/vomiting.  She endorses photophobia and feels better if her eyes are closed. She saw optometrist and ophthalmologist  whose evaluation was normal. She has tried OTC NSAIDs, but had the greatest relief with Ilevro, opthalmic NSAID drops given by her eye doctor.  She also complains of whole body itching.    Soon after her last visit, she started going to the gym and has lost 12lb which has helped with her left thigh numbness.    UPDATE 08/24/2015:  She is scheduled for a sooner appointment because of worsening eye pain over the past week.   Pain is throbbing and worse with eye blinking. She also has some discomfort over her forehead.  She endorses photophobia, especially when driving and often drives with her right eye shut while driving.  Sometimes, she feels as if her eyes had blink so forcefully and it is difficult for her to open them again.  She feels most comfortable with her eyes closed at rest. Over the counter NSAIDs, Flexeril, or Relafen does not provide any relief.  MRI of the brain showed moderate paranasal sinusitis, and otherwise unremarkable. She was referred to Dr. Jenne PaneBates with ENT who reviewed her images and examined her sinuses by endoscopy. There was mild maxillary sinus thickening, however openings were patent and there was only small pooled mucous. He did not feel that her sinusitis is contributing to her eye pain and recommended conservative therapy with irrigation.   Medications:  Current Outpatient Prescriptions on File Prior to Visit  Medication Sig Dispense Refill  . buPROPion (WELLBUTRIN SR) 200 MG 12 hr tablet Take 100 mg by mouth 2 (two) times daily.     . DULoxetine (  CYMBALTA) 60 MG capsule Take 1 capsule by mouth daily. Take in AM  3  . ILEVRO 0.3 % ophthalmic suspension     . omeprazole (PRILOSEC) 40 MG capsule Take 40 mg by mouth daily.     No current facility-administered medications on file prior to visit.     Allergies:  Allergies  Allergen Reactions  . Sulfa Antibiotics Swelling and Rash    Bumps in mouth    Review of Systems:  CONSTITUTIONAL: No fevers, chills, night  sweats, or weight loss.  EYES: No visual changes +eye pain ENT: No hearing changes.  No history of nose bleeds.   RESPIRATORY: No cough, wheezing and shortness of breath.   CARDIOVASCULAR: Negative for chest pain, and palpitations.   GI: Negative for abdominal discomfort, blood in stools or black stools.  No recent change in bowel habits.   GU:  No history of incontinence.   MUSCLOSKELETAL: No history of joint pain or swelling.  No myalgias.   SKIN: Negative for lesions, rash, and itching.   ENDOCRINE: Negative for cold or heat intolerance, polydipsia or goiter.   PSYCH:  + depression or anxiety symptoms.   NEURO: As Above.   Vital Signs:  BP 110/70   Pulse 94   Ht 5\' 6"  (1.676 m)   Wt 209 lb 1 oz (94.8 kg)   LMP 02/21/2012 (Exact Date)   SpO2 98%   BMI 33.74 kg/m   Neurological Exam: MENTAL STATUS including orientation to time, place, person, recent and remote memory, attention span and concentration, language, and fund of knowledge is normal.  Speech is not dysarthric.  CRANIAL NERVES: No visual field defects.  Pupils equal round and reactive to light.  Normal conjugate, extra-ocular eye movements in all directions of gaze.  No ptosis. Normal facial sensation.  Face is symmetric and motor strength is 5/5. There is intermittent bilateral blepharospasm as well as arrhythmical movement of the angles of the mouth, worse on the right.  Palate elevates symmetrically.  Tongue is midline.  MOTOR:  Motor strength is 5/5 in all extremities.  Tone is normal.    MSRs:  Reflexes are 3+/4 throughout, except 1+ Achilles bilaterally.  COORDINATION/GAIT:  Gait shows reduced arm swing bilaterally, otherwise narrow based and stable.    IMPRESSION/PLAN: 1.  Periorbital pain - worsening.  Because her pain is worse with blinking and improved with sustained eye closure, it leads me to think that her pain may be stemming from her blepharospasm. Unfortunately, patient has not had any relief with her  pain with NSAIDs or Flexeril.  I will offer her a trial of low-dose clonazepam 0.25 mg twice daily to see if this can reduce the intensity of her blepharospasm and thereby improve her pain.  2. 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  She sees Dr. Evelene Croon for depression and is currently managed on Wellbutrin 100 mg twice daily. She has been off Abilify since June 2017, so this is less likely a medication side effect.  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   She is scheduled to see my colleague, Dr. Arbutus Leas, for  evaluation for Botox injections, as this is the most effective treatment for Meige's syndrome.   The duration of this appointment visit was 30 minutes of face-to-face time with the patient.  Greater than 50% of this time was spent in counseling, explanation of diagnosis, planning of further management, and coordination of care.  Thank you for allowing me to participate in patient's care.  If I can answer any additional questions, I would be pleased to do so.    Sincerely,    Hillard Goodwine K. Posey Pronto, DO

## 2016-08-23 NOTE — Patient Instructions (Addendum)
1.  Start clonazepam 0.25 (half-tablet) at bedtime and then increase to half-tablet twice daily. 2.  Follow-up with Dr. Arbutus Leasat on January 29th at 9:15am.  Please arrive 15 minutes prior to appointment.

## 2016-09-14 NOTE — Progress Notes (Signed)
Deborah Chase was seen today in neurologic consultation at the request of Dr. Allena Chase.  Her PCP is Deborah Hedger, MD.  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  She is accompanied by her daughter who supplements the history.  The records that were made available to me were reviewed.  Pt was first seen by Dr. Allena Chase in August, 2017, but that was for meralgia paresthetica and she had no facial movements at that time.  She was subsequently seen back in November, 2017 and reported that facial movements had started in approximately September, 2017.  Historically, the patient had reported that she had been on Abilify for about 4-5 years and that was discontinued because of hand tremor.  The movements in the face began about one month after discontinuation of the Abilify.  The patient followed back up with Dr. Allena Chase in January, complaining about eye pain, but it seemed most related to forceful blepharospasm.  Today, she states that eye pain has been going on for 6 months when "I went cold [place] off of caffeine."  She states that it has changed since the beginning of Jan.  Back then, she would note that she would drive with one eye close (and drove at night) because of pain in the eyes.  She initially thinks that she would are briskly close 1 eye because of pain with the light, but now thinks that the eye closes involuntarily.  She is not sure if it is one eye that closes more than another.  Since that time, the pain has evolved to have pain in the frontal part of the head and in both ears.  Some emesis.  She has had 4-5 episodes of the emesis.   Saw ENT and told it was normal examination.  She was started on clonazepam, 0.25 mg twice a day at that visit.  The patient states that this didn't help.  She was given a pain medication but doesn't remember what it was.  Was given first as an injection and then as pills (doesn't recall if toradol).  Was helpful at first but not now.     Neuroimaging of  the brain has previously been performed.  It is available for my review today.  She had an MRI of the brain on 07/28/2016.  This was unremarkable, with the exception of sinusitis, for which she already saw ENT.  There were a few rare punctate T2 hyperintensities.  ALLERGIES:   Allergies  Allergen Reactions  . Sulfa Antibiotics Swelling and Rash    Bumps in mouth    CURRENT MEDICATIONS:  Outpatient Encounter Prescriptions as of 09/17/2016  Medication Sig  . buPROPion (WELLBUTRIN SR) 200 MG 12 hr tablet Take 100 mg by mouth 2 (two) times daily.   . clonazePAM (KLONOPIN) 0.5 MG tablet Take 0.5 tablets (0.25 mg total) by mouth 2 (two) times daily.  . cyclobenzaprine (FLEXERIL) 5 MG tablet   . ILEVRO 0.3 % ophthalmic suspension   . omeprazole (PRILOSEC) 40 MG capsule Take 40 mg by mouth daily.  . [DISCONTINUED] DULoxetine (CYMBALTA) 60 MG capsule Take 1 capsule by mouth daily. Take in AM  . [DISCONTINUED] nabumetone (RELAFEN) 500 MG tablet    No facility-administered encounter medications on file as of 09/17/2016.     PAST MEDICAL HISTORY:   Past Medical History:  Diagnosis Date  . Abnormal uterine bleeding 03-2012   PMB/ Hysteroscopic Polyp resection  . Allergy   . Anxiety   . Depression   .  Diffuse cystic mastopathy 2013  . GERD (gastroesophageal reflux disease) 2009   otc medicine  . Headache(784.0)   . Hypertension    controlled with metoprolol  . Hypothyroidism 2011  . Obesity, unspecified 2013  . Pneumonia 2011  . Shoulder fracture, left   . Special screening for malignant neoplasms, colon 2012    PAST SURGICAL HISTORY:   Past Surgical History:  Procedure Laterality Date  . BREAST BIOPSY Left 2004   fibrosis  . BREAST CYST ASPIRATION Bilateral   . BREAST SURGERY Right 06-2007   Right breast biopsy/ ductal hyperplasia without atypia  . CESAREAN SECTION  1994  . COLONOSCOPY  [id number]   Dr. Evette CristalSankar  . DILATION AND CURETTAGE OF UTERUS    . EXTERNAL EAR SURGERY  2005     ? type  . HYSTEROSCOPY  03/2012   resection of endometrial polyps  . NASAL SINUS SURGERY  1988  . TONSILLECTOMY    . TRACHEOSTOMY  2011    SOCIAL HISTORY:   Social History   Social History  . Marital status: Married    Spouse name: N/A  . Number of children: N/A  . Years of education: N/A   Occupational History  . Not on file.   Social History Main Topics  . Smoking status: Never Smoker  . Smokeless tobacco: Never Used  . Alcohol use No  . Drug use: No  . Sexual activity: No   Other Topics Concern  . Not on file   Social History Narrative   Lives with husband and daughter in a 2 story home.     Works as a Designer, industrial/productlab tech.     Education: associates degree.      FAMILY HISTORY:   Family Status  Relation Status  . Brother Alive  . Mother Deceased  . Father Deceased  . Brother Alive  . Brother Deceased  . Daughter     ROS:  A complete 10 system review of systems was obtained and was unremarkable apart from what is mentioned above.  PHYSICAL EXAMINATION:    VITALS:   Vitals:   09/17/16 0901  BP: 132/68  Pulse: 82  Weight: 214 lb (97.1 kg)  Height: 5\' 6"  (1.676 m)    GEN:  Normal appears female in no acute distress.  Appears stated age. HEENT:  Normocephalic, atraumatic. The mucous membranes are moist. The superficial temporal arteries are without ropiness or tenderness. Cardiovascular: Regular rate and rhythm. Lungs: Clear to auscultation bilaterally. Neck/Heme: There are no carotid bruits noted bilaterally.  NEUROLOGICAL: Orientation:  The patient is alert and oriented x 3.  Fund of knowledge is appropriate.  Recent and remote memory intact.  Attention span and concentration normal.  Repeats and names without difficulty. Cranial nerves: There is good facial symmetry. The pupils are equal round and reactive to light bilaterally. Fundoscopic exam reveals clear disc margins bilaterally. Extraocular muscles are intact and visual fields are full to  confrontational testing. Speech is fluent and clear. Soft palate rises symmetrically and there is no tongue deviation. Hearing is intact to conversational tone. Tone: Tone is good throughout. Sensation: Sensation is intact to light touch and pinprick throughout (facial, trunk, extremities). Vibration is intact at the bilateral big toe. There is no extinction with double simultaneous stimulation. There is no sensory dermatomal level identified. Coordination:  The patient has no difficulty with RAM's or FNF bilaterally. Motor: Strength is 5/5 in the bilateral upper and lower extremities.  Shoulder shrug is equal and symmetric. There is  no pronator drift.  There are no fasciculations noted. DTR's: Deep tendon reflexes are 2+-3-/4 at the bilateral biceps, triceps, brachioradialis, patella and achilles.  Plantar responses are downgoing bilaterally. Gait and Station: The patient is able to ambulate without difficulty. The patient is able to heel toe walk without any difficulty. The patient has mild trouble ambulating in a tandem fashion.. The patient is able to stand in the Romberg position. Abnormal movements: The patient has blepharospasm bilaterally.  This is most evident when the eyes are closed.  She has frequent spontaneous spasms/closure of the left eye more than the right eye.  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   IMPRESSION/PLAN  1.  Meige syndrome  -Her physical examination certainly sounds consistent with Meige syndrome.  Her MRI of the brain was unremarkable.  The anything slightly unusual is the emesis, but this has been reported in patients who have Meige syndrome, due to air swallowing associated with pharyngeal dystonia.  Hers, however, sounds like it could be stemming from migraine.  Regardless, the treatment really would still be botulinum toxin.  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  I told her that if Botox was not helpful, then we would certainly consider doing a lumbar puncture just to make sure that nothing else is going on.  She was agreeable.  We talked extensively about risks, benefits, and side effects of Botox.  We talked about the black box warning.  We talked about the risk of facial asymmetry.  We talked about the risk of ptosis, among many other risks.  She expressed understanding and desire to proceed.  We will try to get prior authorization.  She was given written handouts/patient education from the movement disorder Society as well as [employer].  -Much greater than 50% of this visit was spent in counseling and coordinating care.  Total face to face time:  45 min    Cc:  Deborah Hedger, MD

## 2016-09-17 ENCOUNTER — Ambulatory Visit (INDEPENDENT_AMBULATORY_CARE_PROVIDER_SITE_OTHER): Payer: Managed Care, Other (non HMO) | Admitting: Neurology

## 2016-09-17 ENCOUNTER — Encounter: Payer: Self-pay | Admitting: Neurology

## 2016-09-17 VITALS — BP 132/68 | HR 82 | Ht 66.0 in | Wt 214.0 lb

## 2016-09-17 DIAGNOSIS — G244 Idiopathic orofacial dystonia: Secondary | ICD-10-CM | POA: Diagnosis not present

## 2016-09-25 ENCOUNTER — Telehealth: Payer: Self-pay | Admitting: Neurology

## 2016-09-25 NOTE — Telephone Encounter (Signed)
PT called in regards to Botox approval/Dawn CB# [phone]

## 2016-09-25 NOTE — Telephone Encounter (Signed)
Patient made aware I have submitted prior auth and have not heard back yet.

## 2016-10-03 ENCOUNTER — Ambulatory Visit (INDEPENDENT_AMBULATORY_CARE_PROVIDER_SITE_OTHER): Payer: Managed Care, Other (non HMO) | Admitting: Neurology

## 2016-10-03 DIAGNOSIS — G245 Blepharospasm: Secondary | ICD-10-CM

## 2016-10-03 DIAGNOSIS — G5139 Clonic hemifacial spasm, unspecified: Secondary | ICD-10-CM

## 2016-10-03 DIAGNOSIS — G244 Idiopathic orofacial dystonia: Secondary | ICD-10-CM

## 2016-10-03 MED ORDER — ONABOTULINUMTOXINA 100 UNITS IJ SOLR
40.0000 [IU] | Freq: Once | INTRAMUSCULAR | Status: AC
  Administered 2016-10-03: 40 [IU] via INTRAMUSCULAR

## 2016-10-03 NOTE — Procedures (Signed)
[facility]   Procedure Note Botox  Attending: Dr. Lurena Joinerebecca Yuma Pacella  Preoperative Diagnosis(es): Blepharospasm; meige syndrome    Consent obtained from: The patient Benefits discussed included, but were not limited to ability to open eyes and therefore see better.  Risk discussed included, but were not limited pain and discomfort, bleeding, bruising, excessive weakness, venous thrombosis, muscle atrophy and drooping of eyelids.  A copy of the patient medication guide was given to the patient which explains the blackbox warning.  Patients identity and treatment sites confirmed Yes.  .  Details of Procedure: Skin was cleaned with alcohol.  A 30 gauge, 1/2 inch needle was introduced to the target muscle.  Prior to injection, the needle plunger was aspirated to make sure the needle was not within a blood vessel.  There was no blood retrieved on aspiration.    Following is a summary of the muscles injected  And the amount of Botulinum toxin used:   Dilution 0.9% preservative free saline mixed with 100 u Botox type A to make 5 U per 0.1cc (2 cc of saline used per 100 U botox)  Injections  Location Left  Right Units  Upper lateral orbicularis oculi  2.5 2.5 5.0  Upper medial orbicularis oculi  2.5 2.5 5.0  Lateral orbicularis oculi  5.0 5.0 10  Lower lateral orbicularis oculi  2.5 2.5 5.0  Lower medial orbicularis oculi 2.5 2.5 5.0  Frontalis 2.5/2.5 2.5/2.5 10       TOTAL UNITS:   40   Agent: Botulinum Type A ( Onobotulinum Toxin type A ).  1 vials of Botox were used, each containing 50 units and freshly diluted with 2 mL of sterile, non-perserved saline   Total injected (Units): 40  Total wasted (Units): 60   Pt tolerated procedure well without complications.   Reinjection is anticipated in 3 months.

## 2016-10-22 ENCOUNTER — Telehealth: Payer: Self-pay | Admitting: Neurology

## 2016-10-22 MED ORDER — CLONAZEPAM 0.5 MG PO TABS: 0.2500 mg | ORAL_TABLET | Freq: Two times a day (BID) | ORAL | 2 refills | Status: DC

## 2016-10-22 NOTE — Telephone Encounter (Signed)
PT called and wanted to speak to Dr Tat or Dr Don Perkingat's nurse/Dawn CB# [phone]

## 2016-10-22 NOTE — Telephone Encounter (Signed)
Go ahead and have her restart klonopin and agree with getting records

## 2016-10-22 NOTE — Telephone Encounter (Signed)
1.  Did botox help? 2.  If botox did help, are the spasms and eye pain as bad as pre-botox? 3.  Is she still on the klonopin?  What dose? 4.  Trying to focus via microscope may cause the spasms to come more frequently especially if not well controlled yet with the botox

## 2016-10-22 NOTE — Telephone Encounter (Signed)
Patient states she saw an ophthalmologist in [place] back in October 2017. She can't remember the doctor's name, but they did recommend she see a neuro-ophthalmologist. She will figure out the doctor she call and have them send us records from her visit.

## 2016-10-22 NOTE — Telephone Encounter (Signed)
Spoke with patient she states that she is not having spasms at all just pain. States having some eye pain in the right but mainly the left. The pain and spasms had gone away completely after Botox injection until last Thursday morning. The pain on the left isn't worse but just as bad as before injections. She did stop clonazepam after starting Botox.  Please advise.

## 2016-10-22 NOTE — Telephone Encounter (Signed)
If no spasms any longer, I want her to have eval by ophthalmology to make sure that eyes themselves look okay.  Has she done that?  If not refer to Dr. Gala LewandowskyBowen, [place] [facility]

## 2016-10-22 NOTE — Telephone Encounter (Signed)
Patient made aware RX sent to the pharmacy. °

## 2016-10-22 NOTE — Telephone Encounter (Signed)
Spoke with patient - she states at the end of last week her eye trouble started again.  Having issues with the left side mainly with pain and headaches.  She was made aware Botox can only be given every 3 months but that we may need to make adjustments to next dose.  She also wants to know if her using a microscope pretty much her entire work shift could be causing issues.  Please advise.

## 2016-10-30 ENCOUNTER — Telehealth: Payer: Self-pay | Admitting: Neurology

## 2016-10-30 NOTE — Telephone Encounter (Signed)
Left message on machine for patient to call back.

## 2016-10-30 NOTE — Telephone Encounter (Signed)
Let's just watch and wait then.  I would guess she has an upcoming appt with me in the near future, prior to her next botox?  Wonder if some from eye strain from looking through microscope all day.  Regardless, if better we will just eval at her appt again.

## 2016-10-30 NOTE — Telephone Encounter (Signed)
Spoke with patient. She states twitching is still gone. Eye pain stopped last Wednesday and she hasn't had a problem with it since that time. Please advise.

## 2016-10-30 NOTE — Telephone Encounter (Signed)
Reviewed eye doctor notes from Dr. Louie CasaMiles Whitaker in [place].  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  Said that he noted twitching of the 5th nerve (5th nerve is sensory and not motor to the face).  Recommended referral to neuro-ophthalmolgist, Dr. Augustine RadarGivre.  Deborah Chase, find out how her eye pain is doing and also if twitching is still pretty much gone after botox

## 2016-10-30 NOTE — Telephone Encounter (Signed)
Patient made aware. She has a follow up appt at the end of the month and she agrees with this plan. She will let us know if anything changes prior.

## 2016-11-15 ENCOUNTER — Ambulatory Visit: Payer: Managed Care, Other (non HMO) | Admitting: Neurology

## 2016-12-20 ENCOUNTER — Ambulatory Visit: Payer: Managed Care, Other (non HMO) | Admitting: Neurology

## 2016-12-21 ENCOUNTER — Ambulatory Visit (INDEPENDENT_AMBULATORY_CARE_PROVIDER_SITE_OTHER): Payer: Managed Care, Other (non HMO) | Admitting: Neurology

## 2016-12-21 DIAGNOSIS — G245 Blepharospasm: Secondary | ICD-10-CM

## 2016-12-21 DIAGNOSIS — G244 Idiopathic orofacial dystonia: Secondary | ICD-10-CM

## 2016-12-21 MED ORDER — ONABOTULINUMTOXINA 100 UNITS IJ SOLR
40.0000 [IU] | Freq: Once | INTRAMUSCULAR | Status: AC
  Administered 2016-12-21: 40 [IU] via INTRAMUSCULAR

## 2016-12-21 NOTE — Procedures (Signed)
[facility]   Procedure Note Botox  Attending: Dr. Lurena Joinerebecca Kemya Shed  Preoperative Diagnosis(es): Blepharospasm; meige syndrome  Clinical comment:  Pt noted marked improvement.  Stated that initially did have trouble closing eyes but after few days noted no further blinking/spasms.  No longer has headache.  Very impressed with efficacy of botox.  On examination today patient has no blepharospasm evident UNTIL the needle is inserted to the eyes and then she does have some clinical blepharospasm.      Consent obtained from: The patient Benefits discussed included, but were not limited to ability to open eyes and therefore see better.  Risk discussed included, but were not limited pain and discomfort, bleeding, bruising, excessive weakness, venous thrombosis, muscle atrophy and drooping of eyelids.  A copy of the patient medication guide was given to the patient which explains the blackbox warning.  Patients identity and treatment sites confirmed Yes.  .  Details of Procedure: Skin was cleaned with alcohol.  A 30 gauge, 1/2 inch needle was introduced to the target muscle.  Prior to injection, the needle plunger was aspirated to make sure the needle was not within a blood vessel.  There was no blood retrieved on aspiration.    Following is a summary of the muscles injected  And the amount of Botulinum toxin used:   Dilution 0.9% preservative free saline mixed with 100 u Botox type A to make 5 U per 0.1cc (2 cc of saline used per 100 U botox)  Injections  Location Left  Right Units  Upper lateral orbicularis oculi  2.5 2.5 5.0  Upper medial orbicularis oculi  2.5 2.5 5.0  Lateral orbicularis oculi  5.0 5.0 10  Lower lateral orbicularis oculi  2.5 2.5 5.0  Lower medial orbicularis oculi 2.5 2.5 5.0  Frontalis 2.5/2.5 2.5/2.5 10       TOTAL UNITS:   40   Agent: Botulinum Type A ( Onobotulinum Toxin type A ).  1 vials of Botox were used, each containing 50 units and freshly diluted with 2  mL of sterile, non-perserved saline   Total injected (Units): 40  Total wasted (Units): 0   Pt tolerated procedure well without complications.   Reinjection is anticipated in 3 months.

## 2017-03-29 ENCOUNTER — Ambulatory Visit (INDEPENDENT_AMBULATORY_CARE_PROVIDER_SITE_OTHER): Payer: Managed Care, Other (non HMO) | Admitting: Neurology

## 2017-03-29 ENCOUNTER — Ambulatory Visit: Payer: Managed Care, Other (non HMO) | Admitting: Neurology

## 2017-03-29 DIAGNOSIS — G244 Idiopathic orofacial dystonia: Secondary | ICD-10-CM | POA: Diagnosis not present

## 2017-03-29 DIAGNOSIS — G245 Blepharospasm: Secondary | ICD-10-CM

## 2017-04-26 ENCOUNTER — Ambulatory Visit: Payer: Managed Care, Other (non HMO) | Admitting: Neurology

## 2017-05-06 NOTE — Procedures (Signed)
[facility]   Procedure Note Botox  Attending: Dr. Lurena Joiner Tat  Preoperative Diagnosis(es): Blepharospasm; meige syndrome  Clinical comment:  Pt noted that she is doing really well with botox.  Able to look in microscope at work without issue now and headaches resolved.     Consent obtained from: The patient Benefits discussed included, but were not limited to ability to open eyes and therefore see better.  Risk discussed included, but were not limited pain and discomfort, bleeding, bruising, excessive weakness, venous thrombosis, muscle atrophy and drooping of eyelids.  A copy of the patient medication guide was given to the patient which explains the blackbox warning.  Patients identity and treatment sites confirmed Yes.  .  Details of Procedure: Skin was cleaned with alcohol.  A 30 gauge, 1/2 inch needle was introduced to the target muscle.  Prior to injection, the needle plunger was aspirated to make sure the needle was not within a blood vessel.  There was no blood retrieved on aspiration.    Following is a summary of the muscles injected  And the amount of Botulinum toxin used:   Dilution 0.9% preservative free saline mixed with 100 u Botox type A to make 5 U per 0.1cc (2 cc of saline used per 100 U botox)  Injections  Location Left  Right Units  Upper lateral orbicularis oculi  2.5 2.5 5.0  Upper medial orbicularis oculi  2.5 2.5 5.0  Lateral orbicularis oculi  5.0 5.0 10  Lower lateral orbicularis oculi  2.5 2.5 5.0  Lower medial orbicularis oculi 2.5 2.5 5.0  Frontalis 2.5/2.5 2.5/2.5 10       TOTAL UNITS:   40   Agent: Botulinum Type A ( Onobotulinum Toxin type A ).  1 vials of Botox were used, each containing 50 units and freshly diluted with 2 mL of sterile, non-perserved saline   Total injected (Units): 40  Total wasted (Units): 30   Pt tolerated procedure well without complications.   Reinjection is anticipated in 3 months.

## 2017-05-09 ENCOUNTER — Other Ambulatory Visit: Payer: Self-pay | Admitting: General Surgery

## 2017-05-09 DIAGNOSIS — Z1231 Encounter for screening mammogram for malignant neoplasm of breast: Secondary | ICD-10-CM

## 2017-06-05 ENCOUNTER — Ambulatory Visit (INDEPENDENT_AMBULATORY_CARE_PROVIDER_SITE_OTHER): Payer: Managed Care, Other (non HMO) | Admitting: Obstetrics and Gynecology

## 2017-06-05 ENCOUNTER — Encounter: Payer: Self-pay | Admitting: Obstetrics and Gynecology

## 2017-06-05 VITALS — BP 110/76 | HR 84 | Resp 16 | Ht 65.5 in | Wt 232.0 lb

## 2017-06-05 DIAGNOSIS — Z01419 Encounter for gynecological examination (general) (routine) without abnormal findings: Secondary | ICD-10-CM | POA: Diagnosis not present

## 2017-06-05 NOTE — Progress Notes (Signed)
61 y.o. [id number] Married Caucasian female here for annual exam.    No vaginal bleeding.  ROS - urinary urgency. DF - every 2 - 3 hours.  NF - twice a night.  2 -3 cafeiines per day.  Has dry mouth.   Depression.   Using Botox injections for blepharospasm and facial pain through Neurology.   Has Meige's syndrome.  Sees dermatology yearly.   Daughter married.   PCP:   Raechel Chuteeborah Schoenhoff, MD Psychiatry:  Dr. Evelene CroonKaur  Patient's last menstrual period was 02/21/2012 (exact date).           Sexually active: No.  The current method of family planning is post menopausal status.    Exercising: No.  The patient does not participate in regular exercise at present. Smoker:  no  Health Maintenance: Pap:  05/10/16 Neg:Neg HR HPV  03-23-13 Neg:Neg HR HPV History of abnormal Pap:  no MMG:  06/18/16 BIRADS 1 negative/density c.. [place] has appointment in [place].  She wants to schedule here.  Colonoscopy:  07-14-13 diverticulosis;otherwise normal with Dr. Evette CristalSankar in [place]. Next due 2024.  BMD:   2013  Result  normal TDaP:  Unsure; possibly updated with PCP Gardasil:   n/a HIV: not done Hep C: unsure Screening Labs: PCP    reports that she has never smoked. She has never used smokeless tobacco. She reports that she does not drink alcohol or use drugs.  Past Medical History:  Diagnosis Date  . Abnormal uterine bleeding 03-2012   PMB/ Hysteroscopic Polyp resection  . Allergy   . Anxiety   . Depression   . Diffuse cystic mastopathy 2013  . GERD (gastroesophageal reflux disease) 2009   otc medicine  . Headache(784.0)   . Hypertension    controlled with metoprolol  . Hypothyroidism 2011  . Obesity, unspecified 2013  . Pneumonia 2011  . Shoulder fracture, left   . Special screening for malignant neoplasms, colon 2012    Past Surgical History:  Procedure Laterality Date  . BREAST BIOPSY Left 2004   fibrosis  . BREAST CYST ASPIRATION Bilateral   . BREAST SURGERY Right 06-2007   Right breast biopsy/ ductal hyperplasia without atypia  . CESAREAN SECTION  1994  . COLONOSCOPY  [id number],2014   Dr. Evette CristalSankar  . DILATION AND CURETTAGE OF UTERUS    . EXTERNAL EAR SURGERY  2005   ? type  . HYSTEROSCOPY  03/2012   resection of endometrial polyps  . NASAL SINUS SURGERY  1988  . TONSILLECTOMY    . TRACHEOSTOMY  2011    Current Outpatient Prescriptions  Medication Sig Dispense Refill  . ARIPiprazole (ABILIFY) 5 MG tablet Take 1 tablet by mouth daily.    Marland Kitchen. buPROPion (WELLBUTRIN SR) 200 MG 12 hr tablet Take 100 mg by mouth 2 (two) times daily.     . DULoxetine (CYMBALTA) 60 MG capsule Take 1 capsule by mouth daily.    Marland Kitchen. omeprazole (PRILOSEC) 40 MG capsule Take 40 mg by mouth daily.    . OnabotulinumtoxinA (BOTOX IJ) Inject as directed every 3 (three) months.    . ranitidine (ZANTAC) 300 MG tablet TK 1 T PO Q NIGHT 1 TO 2 H B BED  2   No current facility-administered medications for this visit.     Family History  Problem Relation Age of Onset  . Diabetes Brother   . Crohn's disease Mother   . Hypertension Mother   . Hypertension Father   . Diabetes Brother   . Healthy Daughter  ROS:  Pertinent items are noted in HPI.  Otherwise, a comprehensive ROS was negative.  Exam:   BP 110/76 (BP Location: Right Arm, Patient Position: Sitting, Cuff Size: Large)   Pulse 84   Resp 16   Ht 5' 5.5" (1.664 m)   Wt 232 lb (105.2 kg)   LMP 02/21/2012 (Exact Date)   BMI 38.02 kg/m     General appearance: alert, cooperative and appears stated age Head: Normocephalic, without obvious abnormality, atraumatic Neck: no adenopathy, supple, symmetrical, trachea midline and thyroid normal to inspection and palpation Lungs: clear to auscultation bilaterally Breasts: normal appearance, no masses or tenderness, No nipple retraction or dimpling, No nipple discharge or bleeding, No axillary or supraclavicular adenopathy Heart: regular rate and rhythm Abdomen: soft, non-tender; no masses,  no organomegaly Extremities: extremities normal, atraumatic, no cyanosis or edema Skin: Skin color, texture, turgor normal.  Open scabs.  States she picks them.  Lymph nodes: Cervical, supraclavicular, and axillary nodes normal. No abnormal inguinal nodes palpated Neurologic: Grossly normal  Pelvic: External genitalia:  no lesions              Urethra:  normal appearing urethra with no masses, tenderness or lesions              Bartholins and Skenes: normal                 Vagina: normal appearing vagina with normal color and discharge, no lesions              Cervix: no lesions              Pap taken: No. Bimanual Exam:  Uterus:  normal size, contour, position, consistency, mobility, non-tender              Adnexa: no mass, fullness, tenderness              Rectal exam: Yes.  .  Confirms.              Anus:  normal sphincter tone, no lesions  Chaperone was present for exam.  Assessment:   Well woman visit with normal exam. Urinary frequency. Breast ductal hyperplasia.   Plan: Mammogram screening discussed.  She will schedule with facility here in town. Contact numbers given. Recommended self breast awareness. Pap and HR HPV as above. Guidelines for Calcium, Vitamin D, regular exercise program including cardiovascular and weight bearing exercise. Stop caffeine.  I discussed medication for overactive bladder but patient already has dry mouth.  We will try to control with modification of beverage choices for now.  See dermatology yearly.  Sees breast surgeon yearly.  Follow up annually and prn.     After visit summary provided.

## 2017-06-19 ENCOUNTER — Ambulatory Visit
Admission: RE | Admit: 2017-06-19 | Discharge: 2017-06-19 | Disposition: A | Discharge status: Home / Self Care | Payer: Managed Care, Other (non HMO) | Source: Ambulatory Visit | Attending: General Surgery | Admitting: General Surgery

## 2017-06-19 DIAGNOSIS — Z1231 Encounter for screening mammogram for malignant neoplasm of breast: Secondary | ICD-10-CM | POA: Diagnosis present

## 2017-06-26 ENCOUNTER — Ambulatory Visit: Payer: Managed Care, Other (non HMO) | Admitting: General Surgery

## 2017-06-28 ENCOUNTER — Ambulatory Visit (INDEPENDENT_AMBULATORY_CARE_PROVIDER_SITE_OTHER): Payer: Managed Care, Other (non HMO) | Admitting: Neurology

## 2017-06-28 DIAGNOSIS — G245 Blepharospasm: Secondary | ICD-10-CM

## 2017-06-28 DIAGNOSIS — G244 Idiopathic orofacial dystonia: Secondary | ICD-10-CM

## 2017-06-28 DIAGNOSIS — G5139 Clonic hemifacial spasm, unspecified: Secondary | ICD-10-CM

## 2017-06-28 MED ORDER — ONABOTULINUMTOXINA 100 UNITS IJ SOLR
40.0000 [IU] | Freq: Once | INTRAMUSCULAR | Status: AC
  Administered 2017-06-28: 40 [IU] via INTRAMUSCULAR

## 2017-06-28 NOTE — Procedures (Signed)
[facility]   Procedure Note Botox  Attending: Dr. Lurena Joinerebecca Tat  Preoperative Diagnosis(es): Blepharospasm; meige syndrome  Clinical comment:  Pt pleased with efficacy of botox and having little eye closure that interferes.  No SE with botox    Consent obtained from: The patient Benefits discussed included, but were not limited to ability to open eyes and therefore see better.  Risk discussed included, but were not limited pain and discomfort, bleeding, bruising, excessive weakness, venous thrombosis, muscle atrophy and drooping of eyelids.  A copy of the patient medication guide was given to the patient which explains the blackbox warning.  Patients identity and treatment sites confirmed Yes.  .  Details of Procedure: Skin was cleaned with alcohol.  A 30 gauge, 1/2 inch needle was introduced to the target muscle.  Prior to injection, the needle plunger was aspirated to make sure the needle was not within a blood vessel.  There was no blood retrieved on aspiration.    Following is a summary of the muscles injected  And the amount of Botulinum toxin used:   Dilution 0.9% preservative free saline mixed with 100 u Botox type A to make 5 U per 0.1cc (2 cc of saline used per 100 U botox)  Injections  Location Left  Right Units  Upper lateral orbicularis oculi  2.5 2.5 5.0  Upper medial orbicularis oculi  2.5 2.5 5.0  Lateral orbicularis oculi  5.0 5.0 10  Lower lateral orbicularis oculi  2.5 2.5 5.0  Lower medial orbicularis oculi 2.5 2.5 5.0  Frontalis 2.5/2.5 2.5/2.5 10       TOTAL UNITS:   40   Agent: Botulinum Type A ( Onobotulinum Toxin type A ).  1 vials of Botox were used, each containing 50 units and freshly diluted with 2 mL of sterile, non-perserved saline   Total injected (Units): 40  Total wasted (Units): 0   Pt tolerated procedure well without complications.   Reinjection is anticipated in 3 months.

## 2017-07-08 ENCOUNTER — Telehealth: Payer: Self-pay | Admitting: General Surgery

## 2017-07-08 NOTE — Telephone Encounter (Signed)
I CALLED & SPOKE WITH PATIENT.I WAS CHECKING TO SEE IF SHE WANTED TO RESCHEDULE HER APPOINTMENT SHE CANCELLED FOR 07-10-17 BY AUTOMATIVE LINE.SHE DOES NOT WISH TO RESCHEDULE.SHE WILL BE FOLLOWED BY HER OB-GYN (DR SILVA IN [place]).

## 2017-07-10 ENCOUNTER — Ambulatory Visit: Payer: Managed Care, Other (non HMO) | Admitting: General Surgery

## 2017-07-10 ENCOUNTER — Encounter: Payer: Self-pay | Admitting: *Deleted

## 2017-07-22 LAB — PULMONARY FUNCTION TEST

## 2017-09-27 ENCOUNTER — Ambulatory Visit (INDEPENDENT_AMBULATORY_CARE_PROVIDER_SITE_OTHER): Payer: Managed Care, Other (non HMO) | Admitting: Neurology

## 2017-09-27 DIAGNOSIS — G5139 Clonic hemifacial spasm, unspecified: Secondary | ICD-10-CM | POA: Diagnosis not present

## 2017-09-27 DIAGNOSIS — G245 Blepharospasm: Secondary | ICD-10-CM

## 2017-09-27 MED ORDER — ONABOTULINUMTOXINA 100 UNITS IJ SOLR
40.0000 [IU] | Freq: Once | INTRAMUSCULAR | Status: AC
  Administered 2017-09-27: 40 [IU] via INTRAMUSCULAR

## 2017-09-27 NOTE — Procedures (Signed)
[facility]   Procedure Note Botox  Attending: Dr. Lurena Joinerebecca Loucinda Croy  Preoperative Diagnosis(es): Blepharospasm; meige syndrome  Clinical comment:  Pt pleased with efficacy of botox without SE    Consent obtained from: The patient Benefits discussed included, but were not limited to ability to open eyes and therefore see better.  Risk discussed included, but were not limited pain and discomfort, bleeding, bruising, excessive weakness, venous thrombosis, muscle atrophy and drooping of eyelids.  A copy of the patient medication guide was given to the patient which explains the blackbox warning.  Patients identity and treatment sites confirmed Yes.  .  Details of Procedure: Skin was cleaned with alcohol.  A 30 gauge, 1/2 inch needle was introduced to the target muscle.  Prior to injection, the needle plunger was aspirated to make sure the needle was not within a blood vessel.  There was no blood retrieved on aspiration.    Following is a summary of the muscles injected  And the amount of Botulinum toxin used:   Dilution 0.9% preservative free saline mixed with 100 u Botox type A to make 5 U per 0.1cc (2 cc of saline used per 100 U botox)  Injections  Location Left  Right Units  Upper lateral orbicularis oculi  2.5 2.5 5.0  Upper medial orbicularis oculi  2.5 2.5 5.0  Lateral orbicularis oculi  5.0 5.0 10  Lower lateral orbicularis oculi  2.5 2.5 5.0  Lower medial orbicularis oculi 2.5 2.5 5.0  Frontalis 2.5/2.5 2.5/2.5 10       TOTAL UNITS:   40   Agent: Botulinum Type A ( Onobotulinum Toxin type A ).  1 vials of Botox were used, each containing 50 units and freshly diluted with 2 mL of sterile, non-perserved saline   Total injected (Units): 40  Total wasted (Units): 5.0  Clinical comment:  Was going to spread botox injections to q 4 months but once started injections saw that she still had spasm of eyelids and decided to keep at 3 months.     Pt tolerated procedure well  without complications.   Reinjection is anticipated in 3 months.

## 2017-11-26 ENCOUNTER — Encounter: Payer: Self-pay | Admitting: Pulmonary Disease

## 2017-11-26 ENCOUNTER — Ambulatory Visit: Payer: Managed Care, Other (non HMO) | Admitting: Pulmonary Disease

## 2017-11-26 ENCOUNTER — Ambulatory Visit (INDEPENDENT_AMBULATORY_CARE_PROVIDER_SITE_OTHER)
Admission: RE | Admit: 2017-11-26 | Discharge: 2017-11-26 | Disposition: A | Discharge status: Home / Self Care | Payer: Managed Care, Other (non HMO) | Source: Ambulatory Visit | Attending: Pulmonary Disease | Admitting: Pulmonary Disease

## 2017-11-26 DIAGNOSIS — H6502 Acute serous otitis media, left ear: Secondary | ICD-10-CM | POA: Diagnosis not present

## 2017-11-26 DIAGNOSIS — H6690 Otitis media, unspecified, unspecified ear: Secondary | ICD-10-CM | POA: Insufficient documentation

## 2017-11-26 DIAGNOSIS — R053 Chronic cough: Secondary | ICD-10-CM | POA: Insufficient documentation

## 2017-11-26 DIAGNOSIS — R05 Cough: Secondary | ICD-10-CM | POA: Diagnosis not present

## 2017-11-26 MED ORDER — DEXLANSOPRAZOLE 60 MG PO CPDR: 60.0000 mg | DELAYED_RELEASE_CAPSULE | Freq: Every day | ORAL | 1 refills | Status: DC

## 2017-11-26 NOTE — Progress Notes (Signed)
   Subjective:    Patient ID: Rogene HoustonEvie B Gargus, female    DOB: 03/13/1956, 62 y.o.   MRN: [id number]  HPI    Review of Systems  HENT: Positive for ear discharge, ear pain, postnasal drip and sinus pressure.   Respiratory: Positive for cough and shortness of breath.   Allergic/Immunologic: Positive for environmental allergies.       Objective:   Physical Exam        Assessment & Plan:

## 2017-11-26 NOTE — Progress Notes (Signed)
Subjective:    Patient ID: Deborah HoustonEvie B Halley, female    DOB: 10/08/1955, 62 y.o.   MRN: [id number]  HPI  Chief Complaint  Patient presents with  . Consult    Cough nonprdouctive, SOB with activity   62 year old never smoker, ARDS survivor presents for evaluation of chronic cough. 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  No clear triggers only partial relief with over-the-counter medications. She has been seeing an allergist for at least past year and has been on allergy shots.  She was noted to be allergic to cat and dust mite on skin prick testing. Serial spirometry have not shown any evidence of airway obstruction   She reports back pain when she has a sneeze.  She denies associated shortness of breath or wheezing She sees a psychiatrist for depression and is on Abilify, Wellbutrin and Cymbalta. She has reflux for many years and takes omeprazole with occasional breakthrough. She has neurogenic takes and takes Botox injections under neurology supervision   Reviewed chest x-ray from 01/2016 was does not show any interstitial infiltrates.  She would also like me to look into her ear today.  She has discharge from the left ear.  She had tubes placed in 1988 and has seen another ENT physician recently  Significant tests/ events reviewed 07/2017 spirometry ratio of 82, FEV1 of 89%, FVC of 83%  05/2017 spirometry ratio 78, FEV1 of 75%, FVC of 76%    Past Medical History:  Diagnosis Date  . Abnormal uterine bleeding 03-2012   PMB/ Hysteroscopic Polyp resection  . Allergy   . Anxiety   . Depression   . Diffuse cystic mastopathy 2013  . GERD  (gastroesophageal reflux disease) 2009   otc medicine  . Headache(784.0)   . Hypertension    controlled with metoprolol  . Hypothyroidism 2011  . Obesity, unspecified 2013  . Pneumonia 2011  . Shoulder fracture, left   . Special screening for malignant neoplasms, colon 2012    Past Surgical History:  Procedure Laterality Date  . BREAST BIOPSY Left 2004   fibrosis  . BREAST CYST ASPIRATION Bilateral   . BREAST SURGERY Right 06-2007   Right breast biopsy/ ductal hyperplasia without atypia  . CESAREAN SECTION  1994  . COLONOSCOPY  [id number]   Dr. Evette CristalSankar  . DILATION AND CURETTAGE OF UTERUS    . EXTERNAL EAR SURGERY  2005   ? type  . HYSTEROSCOPY  03/2012   resection of endometrial polyps  . NASAL SINUS SURGERY  1988  . TONSILLECTOMY    . TRACHEOSTOMY  2011    Allergies  Allergen Reactions  . Sulfa Antibiotics Swelling and Rash    Bumps in mouth     Social History   Socioeconomic History  . Marital status: Married    Spouse name: Not on file  . Number of children: Not on file  . Years of education: Not on file  . Highest education level: Not on file  Occupational History  . Not on file  Social Needs  . Financial resource strain: Not on file  . Food insecurity:    Worry: Not on  file    Inability: Not on file  . Transportation needs:    Medical: Not on file    Non-medical: Not on file  Tobacco Use  . Smoking status: Never Smoker  . Smokeless tobacco: Never Used  Substance and Sexual Activity  . Alcohol use: No    Alcohol/week: 0.0 oz  . Drug use: No  . Sexual activity: Never    Partners: Male    Birth control/protection: Post-menopausal  Lifestyle  . Physical activity:    Days per week: Not on file    Minutes per session: Not on file  . Stress: Not on file  Relationships  . Social connections:    Talks on phone: Not on file    Gets together: Not on file    Attends religious service: Not on file    Active member of club or organization: Not on file      Attends meetings of clubs or organizations: Not on file    Relationship status: Not on file  . Intimate partner violence:    Fear of current or ex partner: Not on file    Emotionally abused: Not on file    Physically abused: Not on file    Forced sexual activity: Not on file  Other Topics Concern  . Not on file  Social History Narrative   Lives with husband and daughter in a 2 story home.     Works as a Designer, industrial/product.     Education: associates degree.        Family History  Problem Relation Age of Onset  . Diabetes Brother   . Crohn's disease Mother   . Hypertension Mother   . Hypertension Father   . Diabetes Brother   . Healthy Daughter   . Breast cancer Neg Hx     Review of Systems Positive for shortness of breath with activity, acid heartburn, sneezing, earache and depression  Constitutional: negative for anorexia, fevers and sweats  Eyes: negative for irritation, redness and visual disturbance  Ears, nose, mouth, throat, and face: negative for earaches, epistaxis, nasal congestion and sore throat  Respiratory: negative for cough, dyspnea on exertion, sputum and wheezing  Cardiovascular: negative for chest pain, dyspnea, lower extremity edema, orthopnea, palpitations and syncope  Gastrointestinal: negative for abdominal pain, constipation, diarrhea, melena, nausea and vomiting  Genitourinary:negative for dysuria, frequency and hematuria  Hematologic/lymphatic: negative for bleeding, easy bruising and lymphadenopathy  Musculoskeletal:negative for arthralgias, muscle weakness and stiff joints  Neurological: negative for coordination problems, gait problems, headaches and weakness  Endocrine: negative for diabetic symptoms including polydipsia, polyuria and weight loss     Objective:   Physical Exam  Gen. Pleasant, obese, in no distress, normal affect ENT -left anterior tympanic perforation with purulent discharge, no post nasal drip, class 2 airway Neck: No JVD, no  thyromegaly, no carotid bruits Lungs: no use of accessory muscles, no dullness to percussion, decreased without rales or rhonchi  Cardiovascular: Rhythm regular, heart sounds  normal, no murmurs or gallops, no peripheral edema Abdomen: soft and non-tender, no hepatosplenomegaly, BS normal. Musculoskeletal: No deformities, no cyanosis or clubbing Neuro:  alert, non focal, no tremors       Assessment & Plan:

## 2017-11-26 NOTE — Patient Instructions (Addendum)
Her cough may be related to sinus drip or reflux.  Chest x-ray today. We will undertake treatment trial. Instead of Prilosec, take Dexilant daily for 6 weeks.  For sinus drip, take chlorpheniramine 8 mg at bedtime for 4 weeks, also take store brand Sudafed (eg walphed) during wake time for 4 weeks  Please make appointment ASAP with ENT

## 2017-11-26 NOTE — Assessment & Plan Note (Signed)
cough may be related to sinus drip or reflux. doubt cough variant asthma, no evidence of airway obstruction on spirometry.  Differential diagnosis does include neurogenic cough   Chest x-ray today to look for residual scarring from ARDS. We will undertake treatment trial. Instead of Prilosec, take Dexilant daily for 6 weeks.  For sinus drip, take chlorpheniramine 8 mg at bedtime for 4 weeks, also take store brand Sudafed (eg walphed) during wake time for 4 weeks  Please make appointment ASAP with ENT

## 2017-11-26 NOTE — Assessment & Plan Note (Signed)
I advised her of ear findings and she will contact ENT physician whom he had last seen ASAP

## 2017-11-27 ENCOUNTER — Encounter: Payer: Self-pay | Admitting: Cardiovascular Disease

## 2017-11-27 ENCOUNTER — Ambulatory Visit: Payer: Managed Care, Other (non HMO) | Admitting: Cardiovascular Disease

## 2017-11-27 DIAGNOSIS — R0609 Other forms of dyspnea: Secondary | ICD-10-CM | POA: Diagnosis not present

## 2017-11-27 NOTE — Progress Notes (Signed)
11/27/2017 Deborah HoustonEvie B Chase   10/27/1955  [id number]  Primary Physician Deborah Chase, Deborah Challengereborah D, MD Primary Cardiologist: Deborah GessJonathan J Berry MD Deborah CalamityFACP, FACC, FAHA, [place]  HPI:  Deborah Chase is a 62 y.o. severely overweight married Caucasian female mother of one child who works at [employer]. She was referred to me by Dr. Constance Chase for cardiovascular evaluation because of dyspnea on exertion.she has no cardiac risk factors. She's never smoked. She's never had a heart stroke. There is no family history. She does have dyspnea for the last 3-4 months as well as a chronic cough +18 months evaluated by allergy and pulmonary.   Current Meds  Medication Sig  . ARIPiprazole (ABILIFY) 5 MG tablet Take 1 tablet by mouth daily.  Marland Kitchen. buPROPion (WELLBUTRIN SR) 200 MG 12 hr tablet Take 100 mg by mouth 2 (two) times daily.   Marland Kitchen. dexlansoprazole (DEXILANT) 60 MG capsule Take 1 capsule (60 mg total) by mouth daily.  . DULoxetine (CYMBALTA) 60 MG capsule Take 1 capsule by mouth daily.  Marland Kitchen. omeprazole (PRILOSEC) 40 MG capsule Take 40 mg by mouth daily.  . OnabotulinumtoxinA (BOTOX IJ) Inject as directed every 3 (three) months.     Allergies  Allergen Reactions  . Sulfa Antibiotics Swelling and Rash    Bumps in mouth    Social History   Socioeconomic History  . Marital status: Married    Spouse name: Not on file  . Number of children: Not on file  . Years of education: Not on file  . Highest education level: Not on file  Occupational History  . Not on file  Social Needs  . Financial resource strain: Not on file  . Food insecurity:    Worry: Not on file    Inability: Not on file  . Transportation needs:    Medical: Not on file    Non-medical: Not on file  Tobacco Use  . Smoking status: Never Smoker  . Smokeless tobacco: Never Used  Substance and Sexual Activity  . Alcohol use: No    Alcohol/week: 0.0 oz  . Drug use: No  . Sexual activity: Never    Partners: Male    Birth control/protection:  Post-menopausal  Lifestyle  . Physical activity:    Days per week: Not on file    Minutes per session: Not on file  . Stress: Not on file  Relationships  . Social connections:    Talks on phone: Not on file    Gets together: Not on file    Attends religious service: Not on file    Active member of club or organization: Not on file    Attends meetings of clubs or organizations: Not on file    Relationship status: Not on file  . Intimate partner violence:    Fear of current or ex partner: Not on file    Emotionally abused: Not on file    Physically abused: Not on file    Forced sexual activity: Not on file  Other Topics Concern  . Not on file  Social History Narrative   Lives with husband and daughter in a 2 story home.     Works as a Designer, industrial/productlab tech.     Education: associates degree.       Review of Systems: General: negative for chills, fever, night sweats or weight changes.  Cardiovascular: negative for chest pain, dyspnea on exertion, edema, orthopnea, palpitations, paroxysmal nocturnal dyspnea or shortness of breath Dermatological: negative for rash Respiratory: negative  for cough or wheezing Urologic: negative for hematuria Abdominal: negative for nausea, vomiting, diarrhea, bright red blood per rectum, melena, or hematemesis Neurologic: negative for visual changes, syncope, or dizziness All other systems reviewed and are otherwise negative except as noted above.    Blood pressure 122/70, pulse 78, height 5\' 6"  (1.676 m), weight 234 lb (106.1 kg), last menstrual period 02/21/2012.  General appearance: alert and no distress Neck: no adenopathy, no carotid bruit, no JVD, supple, symmetrical, trachea midline and thyroid not enlarged, symmetric, no tenderness/mass/nodules Lungs: clear to auscultation bilaterally Heart: regular rate and rhythm, S1, S2 normal, no murmur, click, rub or gallop Extremities: extremities normal, atraumatic, no cyanosis or edema Pulses: 2+ and  symmetric Skin: Skin color, texture, turgor normal. No rashes or lesions Neurologic: Alert and oriented X 3, normal strength and tone. Normal symmetric reflexes. Normal coordination and gait  EKG sinus rhythm at 78 without ST or T-wave changes. I personally reviewed this EKG.  ASSESSMENT AND PLAN:   Dyspnea on exertion Deborah Chase  was referred to me by Deborah Chase  for evaluation of dyspnea on exertion. She has no cardiac risk factors. She's never had a heart attack or stroke. She denies chest pain. She has had a chronic cough for the last 18 months has been evaluated by allergy and pulmonary. She says it lasted for much increasing dyspnea on exertion. I am going routine GXT and a 2-Chase echo to further evaluate.      Deborah Gess MD FACP,FACC,FAHA, [facility] 11/27/2017 4:34 PM

## 2017-11-27 NOTE — Patient Instructions (Signed)
Medication Instructions:  Your physician recommends that you continue on your current medications as directed. Please refer to the Current Medication list given to you today.   Labwork: none  Testing/Procedures: Your physician has requested that you have an exercise tolerance test. For further information please visit [email or web address]. Please also follow instruction sheet, as given.  Your physician has requested that you have an echocardiogram. Echocardiography is a painless test that uses sound waves to create images of your heart. It provides your doctor with information about the size and shape of your heart and how well your heart's chambers and valves are working. This procedure takes approximately one hour. There are no restrictions for this procedure.    Follow-Up: Follow up with Dr. Berry as needed.   Any Other Special Instructions Will Be Listed Below (If Applicable).     If you need a refill on your cardiac medications before your next appointment, please call your pharmacy.   

## 2017-11-27 NOTE — Assessment & Plan Note (Signed)
Ms Deborah Chase  was referred to me by Dr. Glorious PeachSchoenhof  for evaluation of dyspnea on exertion. She has no cardiac risk factors. She's never had a heart attack or stroke. She denies chest pain. She has had a chronic cough for the last 18 months has been evaluated by allergy and pulmonary. She says it lasted for much increasing dyspnea on exertion. I am going routine GXT and a 2-D echo to further evaluate.

## 2017-12-12 ENCOUNTER — Ambulatory Visit (INDEPENDENT_AMBULATORY_CARE_PROVIDER_SITE_OTHER): Payer: Managed Care, Other (non HMO)

## 2017-12-12 ENCOUNTER — Other Ambulatory Visit: Payer: Self-pay

## 2017-12-12 ENCOUNTER — Ambulatory Visit (HOSPITAL_COMMUNITY): Payer: Managed Care, Other (non HMO) | Attending: Cardiology

## 2017-12-12 DIAGNOSIS — R0609 Other forms of dyspnea: Secondary | ICD-10-CM

## 2017-12-12 LAB — EXERCISE TOLERANCE TEST
CHL CUP RESTING HR STRESS: 78 {beats}/min
CSEPEDS: 0 s
Estimated workload: 4.6 METS
Exercise duration (min): 3 min
MPHR: 158 {beats}/min
Peak HR: 139 {beats}/min
Percent HR: 87 %
RPE: 17

## 2017-12-12 MED ORDER — PERFLUTREN LIPID MICROSPHERE
1.0000 mL | INTRAVENOUS | Status: AC | PRN
Start: 2017-12-12 — End: 2017-12-12
  Administered 2017-12-12: 2 mL via INTRAVENOUS

## 2017-12-27 ENCOUNTER — Ambulatory Visit (INDEPENDENT_AMBULATORY_CARE_PROVIDER_SITE_OTHER): Payer: Managed Care, Other (non HMO) | Admitting: Neurology

## 2017-12-27 DIAGNOSIS — G245 Blepharospasm: Secondary | ICD-10-CM | POA: Diagnosis not present

## 2017-12-27 DIAGNOSIS — G244 Idiopathic orofacial dystonia: Secondary | ICD-10-CM | POA: Diagnosis not present

## 2017-12-27 MED ORDER — ONABOTULINUMTOXINA 100 UNITS IJ SOLR
40.0000 [IU] | Freq: Once | INTRAMUSCULAR | Status: AC
  Administered 2017-12-27: 40 [IU] via INTRAMUSCULAR

## 2017-12-27 NOTE — Procedures (Signed)
[facility]   Procedure Note Botox  Attending: Dr. Jadier Rockers  Preoperative Diagnosis(es): Blepharospasm; meige syndrome  Clinical comment:  Pt pleased with efficacy of botox without SE.     Consent obtained from: The patient Benefits discussed included, but were not limited to ability to open eyes and therefore see better.  Risk discussed included, but were not limited pain and discomfort, bleeding, bruising, excessive weakness, venous thrombosis, muscle atrophy and drooping of eyelids.  A copy of the patient medication guide was given to the patient which explains the blackbox warning.  Patients identity and treatment sites confirmed Yes.  .  Details of Procedure: Skin was cleaned with alcohol.  A 30 gauge, 1/2 inch needle was introduced to the target muscle.  Prior to injection, the needle plunger was aspirated to make sure the needle was not within a blood vessel.  There was no blood retrieved on aspiration.    Following is a summary of the muscles injected  And the amount of Botulinum toxin used:   Dilution 0.9% preservative free saline mixed with 100 u Botox type A to make 5 U per 0.1cc (2 cc of saline used per 100 U botox)  Injections  Location Left  Right Units  Upper lateral orbicularis oculi  2.5 2.5 5.0  Upper medial orbicularis oculi  2.5 2.5 5.0  Lateral orbicularis oculi  5.0 5.0 10  Lower lateral orbicularis oculi  2.5 2.5 5.0  Lower medial orbicularis oculi 2.5 2.5 5.0  Frontalis 2.5/2.5 2.5/2.5 10       TOTAL UNITS:   40   Agent: Botulinum Type A ( Onobotulinum Toxin type A ).  1 vials of Botox were used, each containing 50 units and freshly diluted with 2 mL of sterile, non-perserved saline   Total injected (Units): 40  Total wasted (Units): 0   Pt tolerated procedure well without complications.   Reinjection is anticipated in 3 months. 

## 2018-01-08 ENCOUNTER — Ambulatory Visit: Payer: Managed Care, Other (non HMO) | Admitting: Pulmonary Disease

## 2018-04-04 ENCOUNTER — Ambulatory Visit (INDEPENDENT_AMBULATORY_CARE_PROVIDER_SITE_OTHER): Payer: Managed Care, Other (non HMO) | Admitting: Neurology

## 2018-04-04 DIAGNOSIS — G245 Blepharospasm: Secondary | ICD-10-CM | POA: Diagnosis not present

## 2018-04-04 MED ORDER — ONABOTULINUMTOXINA 100 UNITS IJ SOLR
45.0000 [IU] | Freq: Once | INTRAMUSCULAR | Status: AC
  Administered 2018-04-04: 45 [IU] via INTRAMUSCULAR

## 2018-04-04 NOTE — Procedures (Signed)
[facility]   Procedure Note Botox  Attending: Dr. Lurena Joinerebecca Avery Klingbeil  Preoperative Diagnosis(es): Blepharospasm; meige syndrome  Clinical comment:  Pt pleased with efficacy of botox without SE.  "twitching a little but not bad"    Consent obtained from: The patient Benefits discussed included, but were not limited to ability to open eyes and therefore see better.  Risk discussed included, but were not limited pain and discomfort, bleeding, bruising, excessive weakness, venous thrombosis, muscle atrophy and drooping of eyelids.  A copy of the patient medication guide was given to the patient which explains the blackbox warning.  Patients identity and treatment sites confirmed Yes.  .  Details of Procedure: Skin was cleaned with alcohol.  A 30 gauge, 1/2 inch needle was introduced to the target muscle.  Prior to injection, the needle plunger was aspirated to make sure the needle was not within a blood vessel.  There was no blood retrieved on aspiration.    Following is a summary of the muscles injected  And the amount of Botulinum toxin used:   Dilution 0.9% preservative free saline mixed with 100 u Botox type A to make 5 U per 0.1cc (2 cc of saline used per 100 U botox)  Injections  Location Left  Right Units  Upper lateral orbicularis oculi  2.5 2.5 5.0  Upper medial orbicularis oculi  2.5 2.5 5.0  Lateral orbicularis oculi  5.0 5.0 10  Lower lateral orbicularis oculi  2.5 2.5 5.0  Lower medial orbicularis oculi 2.5 2.5 5.0  Frontalis 2.5/2.5 2.5/2.5 10       TOTAL UNITS:   40   Agent: Botulinum Type A ( Onobotulinum Toxin type A ).  1 vials of Botox were used, each containing 50 units and freshly diluted with 2 mL of sterile, non-perserved saline   Total injected (Units): 40  Total wasted (Units): 5   Pt tolerated procedure well without complications.   Reinjection is anticipated in 3 months.

## 2018-04-10 ENCOUNTER — Inpatient Hospital Stay (HOSPITAL_COMMUNITY)
Admission: RE | Admit: 2018-04-10 | Discharge: 2018-04-13 | DRG: 885 | Disposition: A | Discharge status: Home / Self Care | Payer: Managed Care, Other (non HMO) | Attending: Psychiatry | Admitting: Psychiatry

## 2018-04-10 ENCOUNTER — Other Ambulatory Visit: Payer: Self-pay

## 2018-04-10 ENCOUNTER — Encounter (HOSPITAL_COMMUNITY): Payer: Self-pay | Admitting: Behavioral Health

## 2018-04-10 DIAGNOSIS — K219 Gastro-esophageal reflux disease without esophagitis: Secondary | ICD-10-CM | POA: Diagnosis present

## 2018-04-10 DIAGNOSIS — F419 Anxiety disorder, unspecified: Secondary | ICD-10-CM | POA: Diagnosis present

## 2018-04-10 DIAGNOSIS — R45851 Suicidal ideations: Secondary | ICD-10-CM | POA: Diagnosis present

## 2018-04-10 DIAGNOSIS — E039 Hypothyroidism, unspecified: Secondary | ICD-10-CM | POA: Diagnosis present

## 2018-04-10 DIAGNOSIS — G4700 Insomnia, unspecified: Secondary | ICD-10-CM | POA: Diagnosis present

## 2018-04-10 DIAGNOSIS — Z8249 Family history of ischemic heart disease and other diseases of the circulatory system: Secondary | ICD-10-CM

## 2018-04-10 DIAGNOSIS — Z882 Allergy status to sulfonamides status: Secondary | ICD-10-CM

## 2018-04-10 DIAGNOSIS — Z79899 Other long term (current) drug therapy: Secondary | ICD-10-CM | POA: Diagnosis not present

## 2018-04-10 DIAGNOSIS — F329 Major depressive disorder, single episode, unspecified: Secondary | ICD-10-CM | POA: Diagnosis not present

## 2018-04-10 DIAGNOSIS — I10 Essential (primary) hypertension: Secondary | ICD-10-CM | POA: Diagnosis present

## 2018-04-10 DIAGNOSIS — F332 Major depressive disorder, recurrent severe without psychotic features: Secondary | ICD-10-CM | POA: Diagnosis present

## 2018-04-10 MED ORDER — PANTOPRAZOLE SODIUM 40 MG PO TBEC
40.0000 mg | DELAYED_RELEASE_TABLET | Freq: Every day | ORAL | Status: DC
  Administered 2018-04-10 – 2018-04-13 (×4): 40 mg via ORAL
  Filled 2018-04-10 (×7): qty 1

## 2018-04-10 MED ORDER — BUPROPION HCL ER (SR) 100 MG PO TB12
100.0000 mg | ORAL_TABLET | Freq: Two times a day (BID) | ORAL | Status: DC
  Administered 2018-04-10 – 2018-04-11 (×2): 100 mg via ORAL
  Filled 2018-04-10 (×5): qty 1

## 2018-04-10 MED ORDER — ARIPIPRAZOLE 5 MG PO TABS
5.0000 mg | ORAL_TABLET | Freq: Every day | ORAL | Status: DC
  Administered 2018-04-10 – 2018-04-13 (×4): 5 mg via ORAL
  Filled 2018-04-10 (×7): qty 1

## 2018-04-10 MED ORDER — DULOXETINE HCL 60 MG PO CPEP
60.0000 mg | ORAL_CAPSULE | Freq: Every day | ORAL | Status: DC
  Administered 2018-04-10 – 2018-04-11 (×2): 60 mg via ORAL
  Filled 2018-04-10 (×4): qty 1

## 2018-04-10 NOTE — [facility] Group Notes (Signed)
[facility] LCSW Group Therapy Note  Date/Time: 04/10/18, 1315  Type of Therapy/Topic:  Group Therapy:  Balance in Life  Participation Level:  Did not attend  Description of Group:    This group will address the concept of balance and how it feels and looks when one is unbalanced. Patients will be encouraged to process areas in their lives that are out of balance, and identify reasons for remaining unbalanced. Facilitators will guide patients utilizing problem- solving interventions to address and correct the stressor making their life unbalanced. Understanding and applying boundaries will be explored and addressed for obtaining  and maintaining a balanced life. Patients will be encouraged to explore ways to assertively make their unbalanced needs known to significant others in their lives, using other group members and facilitator for support and feedback.  Therapeutic Goals: 1. Patient will identify two or more emotions or situations they have that consume much of in their lives. 2. Patient will identify signs/triggers that life has become out of balance:  3. Patient will identify two ways to set boundaries in order to achieve balance in their lives:  4. Patient will demonstrate ability to communicate their needs through discussion and/or role plays  Summary of Patient Progress:          Therapeutic Modalities:   Cognitive Behavioral Therapy Solution-Focused Therapy Assertiveness Training  Greg Makhari Dovidio, LCSW 
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
## 2018-04-10 NOTE — H&P (Signed)
Behavioral Health Medical Screening Exam  Deborah Chase is an 62 y.o. female patient [facility]; with complaints of worsening depression and suicidal ideation with plan to overdose.  Patient unable to contract for safety  Total Time spent with patient: 30 minutes  Psychiatric Specialty Exam: Physical Exam  Vitals reviewed. Constitutional: She is oriented to person, place, and time. She appears well-developed.  Neck: Normal range of motion. Neck supple.  Respiratory: Effort normal.  Musculoskeletal: Normal range of motion.  Neurological: She is alert and oriented to person, place, and time.  Skin: Skin is warm and dry.  Psychiatric: Her speech is normal. Her mood appears anxious. Cognition and memory are normal. She expresses impulsivity. She exhibits a depressed mood. She expresses suicidal ideation. She expresses suicidal plans.    Review of Systems  Psychiatric/Behavioral: Positive for depression and suicidal ideas. The patient is nervous/anxious.   All other systems reviewed and are negative.   Blood pressure (!) 148/74, pulse 71, temperature 98.1 F (36.7 C), resp. rate 16, last menstrual period 02/21/2012, SpO2 99 %.There is no height or weight on file to calculate BMI.  General Appearance: Casual  Eye Contact:  Good  Speech:  Clear and Coherent and Normal Rate  Volume:  Normal  Mood:  Depressed  Affect:  Depressed and Flat  Thought Process:  Coherent and Goal Directed  Orientation:  Full (Time, Place, and Person)  Thought Content:  Logical  Suicidal Thoughts:  Yes.  with intent/plan  Homicidal Thoughts:  No  Memory:  Immediate;   Good Recent;   Good Remote;   Good  Judgement:  Impaired  Insight:  Lacking  Psychomotor Activity:  Normal  Concentration: Concentration: Good and Attention Span: Good  Recall:  Good  Fund of Knowledge:Good  Language: Good  Akathisia:  No  Handed:  Right  AIMS (if indicated):     Assets:  Communication Skills Desire  for Improvement Housing Social Support  Sleep:       Musculoskeletal: Strength & Muscle Tone: within normal limits Gait & Station: normal Patient leans: N/A  Blood pressure (!) 148/74, pulse 71, temperature 98.1 F (36.7 C), resp. rate 16, last menstrual period 02/21/2012, SpO2 99 %.  Recommendations:  Inpatient psychiatric treatment  Based on my evaluation the patient does not appear to have an emergency medical condition.  Jaeson Molstad, NP 04/10/2018, 12:26 PM
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
## 2018-04-10 NOTE — Tx Team (Signed)
Initial Treatment Plan 04/10/2018 2:30 PM Deborah Houstonvie B Stahlecker EAV:[id number]    PATIENT STRESSORS: Medication change or noncompliance   PATIENT STRENGTHS: Average or above average intelligence Communication skills Motivation for treatment/growth Physical Health Supportive family/friends   PATIENT IDENTIFIED PROBLEMS: "Get to feel better so I can go on with life"  "Stabilize on meds"  Suicidal Ideation  Depression  Suicide risk             DISCHARGE CRITERIA:  Improved stabilization in mood, thinking, and/or behavior Verbal commitment to aftercare and medication compliance  PRELIMINARY DISCHARGE PLAN: Outpatient therapy Return to previous living arrangement  PATIENT/FAMILY INVOLVEMENT: This treatment plan has been presented to and reviewed with the patient, Deborah Chase.  The patient and family have been given the opportunity to ask questions and make suggestions.  Cranford MonBeaudry, Rey Dansby Evans, RN 04/10/2018, 2:30 PM

## 2018-04-11 DIAGNOSIS — F332 Major depressive disorder, recurrent severe without psychotic features: Principal | ICD-10-CM

## 2018-04-11 LAB — URINALYSIS, COMPLETE (UACMP) WITH MICROSCOPIC
BILIRUBIN URINE: NEGATIVE
GLUCOSE, UA: NEGATIVE mg/dL
HGB URINE DIPSTICK: NEGATIVE
Ketones, ur: NEGATIVE mg/dL
NITRITE: NEGATIVE
PH: 5 (ref 5.0–8.0)
Protein, ur: NEGATIVE mg/dL
SPECIFIC GRAVITY, URINE: 1.028 (ref 1.005–1.030)

## 2018-04-11 LAB — RAPID URINE DRUG SCREEN, HOSP PERFORMED
Amphetamines: NOT DETECTED
BARBITURATES: NOT DETECTED
Benzodiazepines: NOT DETECTED
Cocaine: NOT DETECTED
Opiates: NOT DETECTED
TETRAHYDROCANNABINOL: NOT DETECTED

## 2018-04-11 MED ORDER — LORAZEPAM 0.5 MG PO TABS
0.5000 mg | ORAL_TABLET | Freq: Four times a day (QID) | ORAL | Status: DC | PRN
  Administered 2018-04-11 – 2018-04-12 (×2): 0.5 mg via ORAL
  Filled 2018-04-11 (×2): qty 1

## 2018-04-11 MED ORDER — DULOXETINE HCL 30 MG PO CPEP
30.0000 mg | ORAL_CAPSULE | Freq: Every day | ORAL | Status: DC
  Administered 2018-04-12 – 2018-04-13 (×2): 30 mg via ORAL
  Filled 2018-04-11 (×4): qty 1

## 2018-04-11 MED ORDER — BUPROPION HCL ER (XL) 300 MG PO TB24
300.0000 mg | ORAL_TABLET | Freq: Every day | ORAL | Status: DC
  Administered 2018-04-12 – 2018-04-13 (×2): 300 mg via ORAL
  Filled 2018-04-11 (×4): qty 1

## 2018-04-11 NOTE — Tx Team (Addendum)
Interdisciplinary Treatment and Diagnostic Plan Update  04/11/2018 Time of Session: 4825 Deborah Chase MRN: [id number]  Principal Diagnosis: <principal problem not specified>  Secondary Diagnoses: Active Problems:   MDD (major depressive disorder), recurrent severe, without psychosis ([place])   Current Medications:  Current Facility-Administered Medications  Medication Dose Route Frequency Provider Last Rate Last Dose  . ARIPiprazole (ABILIFY) tablet 5 mg  5 mg Oral Daily Rankin, Shuvon B, NP   5 mg at 04/11/18 0803  . [START ON 04/12/2018] buPROPion (WELLBUTRIN XL) 24 hr tablet 300 mg  300 mg Oral Daily Cobos, Myer Peer, MD      . Derrill Memo ON 04/12/2018] DULoxetine (CYMBALTA) DR capsule 30 mg  30 mg Oral Daily Cobos, Fernando A, MD      . LORazepam (ATIVAN) tablet 0.5 mg  0.5 mg Oral Q6H PRN Cobos, Myer Peer, MD      . pantoprazole (PROTONIX) EC tablet 40 mg  40 mg Oral Daily Rankin, Shuvon B, NP   40 mg at 04/11/18 0803   PTA Medications: Medications Prior to Admission  Medication Sig Dispense Refill Last Dose  . ARIPiprazole (ABILIFY) 5 MG tablet Take 1 tablet by mouth daily.   Taking  . buPROPion (WELLBUTRIN SR) 200 MG 12 hr tablet Take 100 mg by mouth 2 (two) times daily.    Taking  . dexlansoprazole (DEXILANT) 60 MG capsule Take 1 capsule (60 mg total) by mouth daily. 30 capsule 1 Taking  . DULoxetine (CYMBALTA) 60 MG capsule Take 1 capsule by mouth daily.   Taking  . lithium carbonate (ESKALITH) 450 MG CR tablet Take 450 mg by mouth every evening.  5   . omeprazole (PRILOSEC) 40 MG capsule Take 40 mg by mouth daily.   Taking  . traZODone (DESYREL) 50 MG tablet Take 50-150 mg by mouth at bedtime as needed for sleep.       Patient Stressors: Medication change or noncompliance  Patient Strengths: Average or above average intelligence Communication skills Motivation for treatment/growth Physical Health Supportive family/friends  Treatment Modalities: Medication Management, Group  therapy, Case management,  1 to 1 session with clinician, Psychoeducation, Recreational therapy.   Physician Treatment Plan for Primary Diagnosis: <principal problem not specified> Long Term Goal(s): Improvement in symptoms so as ready for discharge Improvement in symptoms so as ready for discharge   Short Term Goals: Ability to identify changes in lifestyle to reduce recurrence of condition will improve Ability to maintain clinical measurements within normal limits will improve Ability to identify changes in lifestyle to reduce recurrence of condition will improve Ability to verbalize feelings will improve Ability to disclose and discuss suicidal ideas Ability to demonstrate self-control will improve Ability to identify and develop effective coping behaviors will improve Ability to maintain clinical measurements within normal limits will improve  Medication Management: Evaluate patient's response, side effects, and tolerance of medication regimen.  Therapeutic Interventions: 1 to 1 sessions, Unit Group sessions and Medication administration.  Evaluation of Outcomes: Not Met  Physician Treatment Plan for Secondary Diagnosis: Active Problems:   MDD (major depressive disorder), recurrent severe, without psychosis ([place])  Long Term Goal(s): Improvement in symptoms so as ready for discharge Improvement in symptoms so as ready for discharge   Short Term Goals: Ability to identify changes in lifestyle to reduce recurrence of condition will improve Ability to maintain clinical measurements within normal limits will improve Ability to identify changes in lifestyle to reduce recurrence of condition will improve Ability to verbalize feelings will improve Ability to  disclose and discuss suicidal ideas Ability to demonstrate self-control will improve Ability to identify and develop effective coping behaviors will improve Ability to maintain clinical measurements within normal limits will  improve     Medication Management: Evaluate patient's response, side effects, and tolerance of medication regimen.  Therapeutic Interventions: 1 to 1 sessions, Unit Group sessions and Medication administration.  Evaluation of Outcomes: Not Met   RN Treatment Plan for Primary Diagnosis: <principal problem not specified> Long Term Goal(s): Knowledge of disease and therapeutic regimen to maintain health will improve  Short Term Goals: Ability to identify and develop effective coping behaviors will improve and Compliance with prescribed medications will improve  Medication Management: RN will administer medications as ordered by provider, will assess and evaluate patient's response and provide education to patient for prescribed medication. RN will report any adverse and/or side effects to prescribing provider.  Therapeutic Interventions: 1 on 1 counseling sessions, Psychoeducation, Medication administration, Evaluate responses to treatment, Monitor vital signs and CBGs as ordered, Perform/monitor CIWA, COWS, AIMS and Fall Risk screenings as ordered, Perform wound care treatments as ordered.  Evaluation of Outcomes: Not Met   LCSW Treatment Plan for Primary Diagnosis: <principal problem not specified> Long Term Goal(s): Safe transition to appropriate next level of care at discharge, Engage patient in therapeutic group addressing interpersonal concerns.  Short Term Goals: Engage patient in aftercare planning with referrals and resources, Increase social support and Increase skills for wellness and recovery  Therapeutic Interventions: Assess for all discharge needs, 1 to 1 time with Social worker, Explore available resources and support systems, Assess for adequacy in community support network, Educate family and significant other(s) on suicide prevention, Complete Psychosocial Assessment, Interpersonal group therapy.  Evaluation of Outcomes: Not Met   Progress in Treatment: Attending  groups: No. Participating in groups: No. Taking medication as prescribed: Yes. Toleration medication: Yes. Family/Significant other contact made: No, will contact:  pt declined consent Patient understands diagnosis: Yes. Discussing patient identified problems/goals with staff: Yes. Medical problems stabilized or resolved: Yes. Denies suicidal/homicidal ideation: Yes. Issues/concerns per patient self-inventory: No. Other: none  New problem(s) identified: No, Describe:  none  New Short Term/Long Term Goal(s):  Patient Goals:  Wants to locate DNA medication testing, quick discharge.  Discharge Plan or Barriers:   Reason for Continuation of Hospitalization: Depression Medication stabilization  Estimated Length of Stay: 3-5 days.  Attendees: Patient: Deborah Chase 04/11/2018   Physician: Dr Parke Poisson, MD 04/11/2018   Nursing: Baldo Daub, RN 04/11/2018   RN Care Manager: 04/11/2018   Social Worker: Lurline Idol, LCSW 04/11/2018   Recreational Therapist:  04/11/2018   Other:  04/11/2018   Other:  04/11/2018   Other: 04/11/2018      Scribe for Treatment Team: Joanne Chars, LCSW 04/11/2018 3:39 PM

## 2018-04-11 NOTE — Plan of Care (Signed)
  Problem: Education: Goal: Knowledge of [place] [facility] Education information/materials will improve Outcome: Progressing Goal: Emotional status will improve Outcome: Progressing Goal: Mental status will improve Outcome: Progressing   

## 2018-04-11 NOTE — Progress Notes (Signed)
Adult Psychoeducational Group Note  Date:  04/11/2018 Time:  4:59 AM  Group Topic/Focus:  Wrap-Up Group:   The focus of this group is to help patients review their daily goal of treatment and discuss progress on daily workbooks.  Participation Level:  Active  Participation Quality:  Appropriate  Affect:  Appropriate  Cognitive:  Appropriate   Insight: Appropriate  Engagement in Group:  Engaged  Modes of Intervention:  Discussion  Additional Comments:  Pt stated she feels better tonight than earlier.  Pt rated the day at a 7/10.  Akil Hoos 04/11/2018, 4:59 AM

## 2018-04-11 NOTE — Progress Notes (Signed)
Recreation Therapy Notes  Date: 8.23.19 Time: 0930 Location: [address] Dayroom  Group Topic: Stress Management  Goal Area(s) Addresses:  Patient will verbalize importance of using healthy stress management.  Patient will identify positive emotions associated with healthy stress management.   Behavioral Response: Engaged  Intervention: Stress Management  Activity :  Progressive Muscle Relaxation.  LRT introduced the stress management technique of progressive muscle relaxation.  LRT read a script on tensing then relaxing each muscle group individually.  Patients were to follow along as LRT read the script to engage in the activity.  Education:  Stress Management, Discharge Planning.   Education Outcome: Acknowledges edcuation/In group clarification offered/Needs additional education  Clinical Observations/Feedback: Patient attended and participated in group.    Lew Prout, LRT/CTRS         Stephens Shreve A 04/11/2018 12:05 PM 

## 2018-04-11 NOTE — Progress Notes (Signed)
D: Pt was in bed in her room upon initial approach.  Pt presents with depressed affect and mood.  Describes her day as "so so" and denies having a goal tonight stating "I just got here today."  Pt is adjusting to milieu.  Pt denies SI/HI, denies hallucinations, denies pain.  Pt has been isolative to her room at times with few peer interactions.   A: Introduced self to pt.  Actively listened to pt and offered support and encouragement. Q15 minute safety checks maintained.  R: Pt is safe on the unit.  Pt verbally contracts for safety.  Will continue to monitor and assess.

## 2018-04-11 NOTE — Progress Notes (Signed)
D: Patient visible in the milieu.  She is observed in the day room interacting with her peers.  Her affect is flat, blunted; her mood is depressed.  She rates her depression as a 6; hopelessness as a 5; anxiety as a 3.  She denies any thoughts of self harm today.  She slept fair; appetite is fair; energy level is normal and her concentration is good.  She is hoping to get her medication stabilized, as she feels that is part of why she is here.  A: Continue to monitor medication management and MD orders.  Safety checks completed every 15 minutes per protocol.  Offer support and encouragement as needed.  R: Patient is receptive to staff; her behavior is appropriate.
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
## 2018-04-11 NOTE — Progress Notes (Signed)
Patient's BP elevated since yesterday's admission.  She states she takes nothing at home.  This morning 158/72 and 167/84.

## 2018-04-11 NOTE — [facility] Group Notes (Signed)
  [facility]  Date/Time: 04/11/18, 1315  Type of Therapy/Topic:  Group Therapy:  Emotion Regulation  Participation Level:  Active   Mood:pleasant  Description of Group:    The purpose of this group is to assist patients in learning to regulate negative emotions and experience positive emotions. Patients will be guided to discuss ways in which they have been vulnerable to their negative emotions. These vulnerabilities will be juxtaposed with experiences of positive emotions or situations, and patients challenged to use positive emotions to combat negative ones. Special emphasis will be placed on coping with negative emotions in conflict situations, and patients will process healthy conflict resolution skills.  Therapeutic Goals: 1. Patient will identify two positive emotions or experiences to reflect on in order to balance out negative emotions:  2. Patient will label two or more emotions that they find the most difficult to experience:  3. Patient will be able to demonstrate positive conflict resolution skills through discussion or role plays:   Summary of Patient Progress:Pt reports depression and hopelessness are difficult emotions for her to experience.  Pt was active in group discussion regarding positive ways to deal with difficult emotions.        Therapeutic Modalities:   Cognitive Behavioral Therapy Feelings Identification Dialectical Behavioral Therapy  Daleen SquibbGreg Arad Burston, LCSW

## 2018-04-11 NOTE — [facility] Suicide Risk Assessment (Signed)
[facility] INPATIENT:  Family/Significant Other Suicide Prevention Education  Suicide Prevention Education:  Patient Refusal for Family/Significant Other Suicide Prevention Education: The patient Deborah Chase has refused to provide written consent for family/significant other to be provided Family/Significant Other Suicide Prevention Education during admission and/or prior to discharge.  Physician notified.  Ida RogueRodney B Jeri Jeanbaptiste 04/11/2018, 3:32 PM
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
## 2018-04-12 DIAGNOSIS — F329 Major depressive disorder, single episode, unspecified: Secondary | ICD-10-CM

## 2018-04-12 DIAGNOSIS — G4700 Insomnia, unspecified: Secondary | ICD-10-CM

## 2018-04-12 DIAGNOSIS — F419 Anxiety disorder, unspecified: Secondary | ICD-10-CM

## 2018-04-12 DIAGNOSIS — Z79899 Other long term (current) drug therapy: Secondary | ICD-10-CM

## 2018-04-12 LAB — CBC WITH DIFFERENTIAL/PLATELET
BASOS PCT: 1 %
Basophils Absolute: 0.1 10*3/uL (ref 0.0–0.1)
EOS ABS: 0.3 10*3/uL (ref 0.0–0.7)
Eosinophils Relative: 4 %
HEMATOCRIT: 45 % (ref 36.0–46.0)
Hemoglobin: 15 g/dL (ref 12.0–15.0)
Lymphocytes Relative: 31 %
Lymphs Abs: 2.4 10*3/uL (ref 0.7–4.0)
MCH: 31.1 pg (ref 26.0–34.0)
MCHC: 33.3 g/dL (ref 30.0–36.0)
MCV: 93.2 fL (ref 78.0–100.0)
MONOS PCT: 9 %
Monocytes Absolute: 0.7 10*3/uL (ref 0.1–1.0)
Neutro Abs: 4.3 10*3/uL (ref 1.7–7.7)
Neutrophils Relative %: 55 %
Platelets: 274 10*3/uL (ref 150–400)
RBC: 4.83 MIL/uL (ref 3.87–5.11)
RDW: 13.4 % (ref 11.5–15.5)
WBC: 7.7 10*3/uL (ref 4.0–10.5)

## 2018-04-12 LAB — TSH: TSH: 2.242 u[IU]/mL (ref 0.350–4.500)

## 2018-04-12 LAB — LIPID PANEL
Cholesterol: 183 mg/dL (ref 0–200)
HDL: 42 mg/dL (ref >40)
LDL Cholesterol: 116 mg/dL — ABNORMAL HIGH (ref 0–99)
Total CHOL/HDL Ratio: 4.4 RATIO
Triglycerides: 126 mg/dL (ref <150)
VLDL: 25 mg/dL (ref 0–40)

## 2018-04-12 LAB — BASIC METABOLIC PANEL
Anion gap: 10 (ref 5–15)
BUN: 12 mg/dL (ref 8–23)
CALCIUM: 9.1 mg/dL (ref 8.9–10.3)
CO2: 25 mmol/L (ref 22–32)
Chloride: 107 mmol/L (ref 98–111)
Creatinine, Ser: 1.07 mg/dL — ABNORMAL HIGH (ref 0.44–1.00)
GFR calc non Af Amer: 54 mL/min — ABNORMAL LOW (ref >60)
Glucose, Bld: 110 mg/dL — ABNORMAL HIGH (ref 70–99)
Potassium: 4.1 mmol/L (ref 3.5–5.1)
SODIUM: 142 mmol/L (ref 135–145)

## 2018-04-12 LAB — HEMOGLOBIN A1C
HEMOGLOBIN A1C: 5.3 % (ref 4.8–5.6)
Mean Plasma Glucose: 105.41 mg/dL

## 2018-04-12 NOTE — Progress Notes (Signed)
Patient verbalized that she had a good day overall and that she enjoyed the anger management group. Her goal for tomorrow is to work on getting discharged.

## 2018-04-12 NOTE — [facility] Group Notes (Signed)
LCSW Group Therapy Note  04/12/2018   10:00-11:00am   Type of Therapy and Topic:  Group Therapy: Anger Cues and Responses  Participation Level:  Active   Description of Group:   In this group, patients learned how to recognize the physical, cognitive, emotional, and behavioral responses they have to anger-provoking situations.  They identified a recent time they became angry and how they reacted.  They analyzed how their reaction was possibly beneficial and how it was possibly unhelpful.  The group discussed a variety of healthier coping skills that could help with such a situation in the future.  Deep breathing was practiced briefly.  Therapeutic Goals: 1. Patients will remember their last incident of anger and how they felt emotionally and physically, what their thoughts were at the time, and how they behaved. 2. Patients will identify how their behavior at that time worked for them, as well as how it worked against them. 3. Patients will explore possible new behaviors to use in future anger situations. 4. Patients will learn that anger itself is normal and cannot be eliminated, and that healthier reactions can assist with resolving conflict rather than worsening situations.  Summary of Patient Progress:  The patient shared that her most recent time of anger was Wednesday evening and said her husband asked her to take him to work at a time when she had already purchased tickets for a concert and as a result could not attend it.  She went on later to talk about her resentment toward his medical condition.  She displayed insight.  Therapeutic Modalities:   Cognitive Behavioral Therapy  Lynnell ChadMareida J Grossman-Orr

## 2018-04-12 NOTE — Progress Notes (Signed)
Nursing Progress Note: 7-7p  D- Mood is depressed, smiles on approachand. Af. Pt is able to contract for safety. Sleep and appetite have improve. Goal for today is medication adjustment.  A - Observed pt interacting in group and in the milieu.Support and encouragement offered, safety maintained with q 15 minutes.   R-Contracts for safety and continues to follow treatment plan, working on learning new coping skills for stress.Pt is to work on discharge plans and states she has and outside therapist.

## 2018-04-12 NOTE — Progress Notes (Signed)
Adult Psychoeducational Group Note  Date:  04/12/2018 Time: 1:00 pm  Group Topic/Focus:  Relaxation and Meditation  Participation Level:  Active  Participation Quality:  Appropriate  Affect:  Appropriate  Cognitive:  Alert and Appropriate  Insight: Appropriate  Engagement in Group:  Developing/Improving  Modes of Intervention:  Discussion and Education  Additional Comments:  Pt actively participated in group and was able to relax for 10 minutes .  Jimmey Ralpherez, Tymir Terral M 04/12/2018, 3:17 PM

## 2018-04-12 NOTE — Plan of Care (Signed)
D: Pt denies SI/HI/AVH. Pt is pleasant and cooperative. Pt stated she was doing better due to not being SI . Pt stated she was still a little depressed. Pt said she was concerned that her husband has been decompensating and was feeling down about that, but pt has appointment next week to see about him.  A: Pt was offered support and encouragement. Pt was given scheduled medications. Pt was encourage to attend groups. Q 15 minute checks were done for safety.   R:Pt attends groups and interacts well with peers and staff. Pt is taking medication. Pt has no complaints.Pt receptive to treatment and safety maintained on unit.   Problem: Education: Goal: Emotional status will improve Outcome: Progressing   Problem: Education: Goal: Mental status will improve Outcome: Progressing   Problem: Activity: Goal: Sleeping patterns will improve Outcome: Progressing

## 2018-04-12 NOTE — Progress Notes (Signed)
Writer entered patients room and observed her lying in bed resting. Writer spoke with her 1:1 and she reports that she is hoping to discharge soon. Writer informed her of medications available if needed and encouraged her to attend group. She remained in the dayroom for a brief period before requesting ativan and returned to her room. Safety maintained on unit with 15 min checks.

## 2018-04-12 NOTE — Progress Notes (Signed)
[facility] MD Progress Note  04/12/2018 10:52 AM Deborah Chase  MRN:  [id number]   Subjective:  I been feeling better since I been off the Lithium. I feel like it was making me suicidal. Im not having any suicidal thoughts and Im thinking happy thought instead of negative thoughts, and looking forward to things.  Objective: On evaluation the patient reported: Patient states that she feels great.  States that she is eating/sleeping without difficulty; tolerating medications without adverse reactions.  She rates her depression 3/10, anxiety 2/10, hopelessness 1/10 on likert scale with 10 being the worse. She is engaging well and smiles accordingly. She is observed joking with staff. She is currently taking Wellbutrin XL 343m, Abilify 546mpo daily. She was started on Cymbalta 3035mo daily as dose reduction. She is tolerating these medications well with the exception of a dry mouth. She inquired about increasing Wellbutrin XL 450, however has signed a 72 hour that expires tomorrow. Discussed with patient rapid titration of medication in a short duration is not recommended. Reports that she continues to attend/participate in group which is helping her learn to communicate better. At this time patient denies suicidal/self harming thoughts an psychosis.    Principal Problem: <principal problem not specified>   Diagnosis:   Patient Active Problem List   Diagnosis Date Noted  . MDD (major depressive disorder), recurrent severe, without psychosis (HCCRavenelF33.2] 04/10/2018  . Dyspnea on exertion [R06.09] 11/27/2017  . Chronic cough [R05] 11/26/2017  . Otitis media [H66.90] 11/26/2017  . Meralgia paresthetica [G57.10] 03/30/2016  . 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 Diffuse cystic mastopathy [N60.19] 06/15/2013   Total Time spent with patient: 30 minutes  Past Psychiatric History: one prior psychiatric admission about 35 years ago for depression following death of a brother, no history of  suicide attempts, no history of self cutting, no history of psychosis, reports long history of depression, reports brief episodes of improved mood but does not endorse any clear history of mania/hypomania at this time. Denies panic disorder, denies agoraphobia. Denies social anxiety. Reports she does feel she worries excessively .  Denies history of PTSD  Previous Psychotropic Medications: Abilify 5 mgrs QDAY , x 8 years, has been on higher doses before, Wellbutrin SR 200m36mBID x 8 years , Cymbalta 60 mgrs QDAY , x 4 years,  Lithium 450 mgrs QDAY x 2 months, Trintellix, started 2 days ago. States she has been on multiple medications in the past, states she feels Wellbutrin , Abilify and Cymbalta have been more effective.   Past Medical History:  Past Medical History:  Diagnosis Date  . Abnormal uterine bleeding 03-2012   PMB/ Hysteroscopic Polyp resection  . Allergy   . Anxiety   . Depression   . Diffuse cystic mastopathy 2013  . GERD (gastroesophageal reflux disease) 2009   otc medicine  . Headache(784.0)   . Hypertension    controlled with metoprolol  . Hypothyroidism 2011  . Obesity, unspecified 2013  . Pneumonia 2011  . Shoulder fracture, left   . Special screening for malignant neoplasms, colon 2012    Past Surgical History:  Procedure Laterality Date  . BREAST BIOPSY Left 2004   fibrosis  . BREAST CYST ASPIRATION Bilateral   . BREAST SURGERY Right 06-2007   Right breast biopsy/ ductal hyperplasia without atypia  . CESAREAN SECTION  1994  . COLONOSCOPY  [id number]. SankJamal CollinDILATION AND CURETTAGE OF UTERUS    . EXTERNAL EAR  SURGERY  2005   ? type  . HYSTEROSCOPY  03/2012   resection of endometrial polyps  . NASAL SINUS SURGERY  1988  . TONSILLECTOMY    . TRACHEOSTOMY  2011   Family History:  Family History  Problem Relation Age of Onset  . Diabetes Brother   . Crohn's disease Mother   . Hypertension Mother   . Hypertension Father   . Diabetes Brother    . Healthy Daughter   . Breast cancer Neg Hx    Family Psychiatric  History: reports there is a history of depression in maternal family, no suicides in family, no alcohol use disorder in family Social History:  Social History   Substance and Sexual Activity  Alcohol Use No  . Alcohol/week: 0.0 standard drinks     Social History   Substance and Sexual Activity  Drug Use No    Social History   Socioeconomic History  . Marital status: Married    Spouse name: Not on file  . Number of children: 1  . Years of education: Not on file  . Highest education level: Not on file  Occupational History  . Not on file  Social Needs  . Financial resource strain: Not on file  . Food insecurity:    Worry: Not on file    Inability: Not on file  . Transportation needs:    Medical: Not on file    Non-medical: Not on file  Tobacco Use  . Smoking status: Never Smoker  . Smokeless tobacco: Never Used  Substance and Sexual Activity  . Alcohol use: No    Alcohol/week: 0.0 standard drinks  . Drug use: No  . Sexual activity: Never    Partners: Male    Birth control/protection: Post-menopausal  Lifestyle  . Physical activity:    Days per week: Not on file    Minutes per session: Not on file  . Stress: Not on file  Relationships  . Social connections:    Talks on phone: Not on file    Gets together: Not on file    Attends religious service: Not on file    Active member of club or organization: Not on file    Attends meetings of clubs or organizations: Not on file    Relationship status: Not on file  Other Topics Concern  . Not on file  Social History Narrative   Lives with husband and daughter in a 2 story home.     Works as a Quarry manager.     Education: associates degree.     Additional Social History:    Pain Medications: denies Prescriptions: denies Over the Counter: denies History of alcohol / drug use?: No history of alcohol / drug abuse Longest period of sobriety (when/how  long): denies                    Sleep: Fair  Appetite:  Fair  Current Medications: Current Facility-Administered Medications  Medication Dose Route Frequency Provider Last Rate Last Dose  . ARIPiprazole (ABILIFY) tablet 5 mg  5 mg Oral Daily Rankin, Shuvon B, NP   5 mg at 04/12/18 0804  . buPROPion (WELLBUTRIN XL) 24 hr tablet 300 mg  300 mg Oral Daily Cobos, Myer Peer, MD   300 mg at 04/12/18 0804  . DULoxetine (CYMBALTA) DR capsule 30 mg  30 mg Oral Daily Cobos, Myer Peer, MD   30 mg at 04/12/18 0804  . LORazepam (ATIVAN) tablet 0.5 mg  0.5  mg Oral Q6H PRN Cobos, Myer Peer, MD   0.5 mg at 04/11/18 2113  . pantoprazole (PROTONIX) EC tablet 40 mg  40 mg Oral Daily Rankin, Shuvon B, NP   40 mg at 04/12/18 5997    Lab Results:  Results for orders placed or performed during the hospital encounter of 04/10/18 (from the past 48 hour(s))  Urinalysis, Complete w Microscopic     Status: Abnormal   Collection Time: 04/11/18 11:57 AM  Result Value Ref Range   Color, Urine YELLOW YELLOW   APPearance TURBID (A) CLEAR   Specific Gravity, Urine 1.028 1.005 - 1.030   pH 5.0 5.0 - 8.0   Glucose, UA NEGATIVE NEGATIVE mg/dL   Hgb urine dipstick NEGATIVE NEGATIVE   Bilirubin Urine NEGATIVE NEGATIVE   Ketones, ur NEGATIVE NEGATIVE mg/dL   Protein, ur NEGATIVE NEGATIVE mg/dL   Nitrite NEGATIVE NEGATIVE   Leukocytes, UA MODERATE (A) NEGATIVE   WBC, UA 0-5 0 - 5 WBC/hpf   Bacteria, UA RARE (A) NONE SEEN   Squamous Epithelial / LPF 0-5 0 - 5   Mucus PRESENT    Amorphous Crystal PRESENT     Comment: Performed at [facility], [place] [address] [place], [place] [address]  Rapid urine drug screen (hospital performed)     Status: None   Collection Time: 04/11/18 11:57 AM  Result Value Ref Range   Opiates NONE DETECTED NONE DETECTED   Cocaine NONE DETECTED NONE DETECTED   Benzodiazepines NONE DETECTED NONE DETECTED   Amphetamines NONE DETECTED NONE DETECTED    Tetrahydrocannabinol NONE DETECTED NONE DETECTED   Barbiturates NONE DETECTED NONE DETECTED    Comment: (NOTE) DRUG SCREEN FOR MEDICAL PURPOSES ONLY.  IF CONFIRMATION IS NEEDED FOR ANY PURPOSE, NOTIFY LAB WITHIN 5 DAYS. LOWEST DETECTABLE LIMITS FOR URINE DRUG SCREEN Drug Class                     Cutoff (ng/mL) Amphetamine and metabolites    1000 Barbiturate and metabolites    200 Benzodiazepine                 395 Tricyclics and metabolites     300 Opiates and metabolites        300 Cocaine and metabolites        300 THC                            50 Performed at [facility], [place] [address], [place], [place] [address]   CBC with Differential/Platelet     Status: None   Collection Time: 04/12/18  6:49 AM  Result Value Ref Range   WBC 7.7 4.0 - 10.5 K/uL   RBC 4.83 3.87 - 5.11 MIL/uL   Hemoglobin 15.0 12.0 - 15.0 g/dL   HCT 45.0 36.0 - 46.0 %   MCV 93.2 78.0 - 100.0 fL   MCH 31.1 26.0 - 34.0 pg   MCHC 33.3 30.0 - 36.0 g/dL   RDW 13.4 11.5 - 15.5 %   Platelets 274 150 - 400 K/uL   Neutrophils Relative % 55 %   Neutro Abs 4.3 1.7 - 7.7 K/uL   Lymphocytes Relative 31 %   Lymphs Abs 2.4 0.7 - 4.0 K/uL   Monocytes Relative 9 %   Monocytes Absolute 0.7 0.1 - 1.0 K/uL   Eosinophils Relative 4 %   Eosinophils Absolute 0.3 0.0 - 0.7 K/uL  Basophils Relative 1 %   Basophils Absolute 0.1 0.0 - 0.1 K/uL    Comment: Performed at [facility], [place] [address] [place], [place] [address]  Basic metabolic panel     Status: Abnormal   Collection Time: 04/12/18  6:49 AM  Result Value Ref Range   Sodium 142 135 - 145 mmol/L   Potassium 4.1 3.5 - 5.1 mmol/L   Chloride 107 98 - 111 mmol/L   CO2 25 22 - 32 mmol/L   Glucose, Bld 110 (H) 70 - 99 mg/dL   BUN 12 8 - 23 mg/dL   Creatinine, Ser 1.07 (H) 0.44 - 1.00 mg/dL   Calcium 9.1 8.9 - 10.3 mg/dL   GFR calc non Af Amer 54 (L) >60 mL/min   GFR calc Af Amer >60 >60 mL/min    Comment: (NOTE) The  eGFR has been calculated using the CKD EPI equation. This calculation has not been validated in all clinical situations. eGFR's persistently <60 mL/min signify possible Chronic Kidney Disease.    Anion gap 10 5 - 15    Comment: Performed at [facility], [place] [address], [place], [place] [address]  TSH     Status: None   Collection Time: 04/12/18  6:49 AM  Result Value Ref Range   TSH 2.242 0.350 - 4.500 uIU/mL    Comment: Performed by a 3rd Generation assay with a functional sensitivity of <=0.01 uIU/mL. Performed at [facility], [place] [address], [place], [place] [address]   Lipid panel     Status: Abnormal   Collection Time: 04/12/18  6:49 AM  Result Value Ref Range   Cholesterol 183 0 - 200 mg/dL   Triglycerides 126 <150 mg/dL   HDL 42 >40 mg/dL   Total CHOL/HDL Ratio 4.4 RATIO   VLDL 25 0 - 40 mg/dL   LDL Cholesterol 116 (H) 0 - 99 mg/dL    Comment:        Total Cholesterol/HDL:CHD Risk Coronary Heart Disease Risk Table                     Men   Women  1/2 Average Risk   3.4   3.3  Average Risk       5.0   4.4  2 X Average Risk   9.6   7.1  3 X Average Risk  23.4   11.0        Use the calculated Patient Ratio above and the CHD Risk Table to determine the patient's CHD Risk.        ATP III CLASSIFICATION (LDL):  <100     mg/dL   Optimal  100-129  mg/dL   Near or Above                    Optimal  130-159  mg/dL   Borderline  160-189  mg/dL   High  >190     mg/dL   Very High Performed at [place] [address], [place],  [address]     Blood Alcohol level:  No results found for: [facility]  Metabolic Disorder Labs: No results found for: HGBA1C, MPG No results found for: PROLACTIN Lab Results  Component Value Date   CHOL 183 04/12/2018   TRIG 126 04/12/2018   HDL 42 04/12/2018   CHOLHDL 4.4 04/12/2018   VLDL 25 04/12/2018   LDLCALC 116 (H) 04/12/2018    Physical Findings: AIMS: Facial  and Oral  Movements Muscles of Facial Expression: None, normal Lips and Perioral Area: None, normal Jaw: None, normal Tongue: None, normal,Extremity Movements Upper (arms, wrists, hands, fingers): None, normal Lower (legs, knees, ankles, toes): None, normal, Trunk Movements Neck, shoulders, hips: None, normal, Overall Severity Severity of abnormal movements (highest score from questions above): None, normal Incapacitation due to abnormal movements: None, normal Patient's awareness of abnormal movements (rate only patient's report): No Awareness, Dental Status Current problems with teeth and/or dentures?: No Does patient usually wear dentures?: No  CIWA:    COWS:     Musculoskeletal: Strength & Muscle Tone: within normal limits Gait & Station: normal Patient leans: N/A  Psychiatric Specialty Exam: Physical Exam  ROS  Blood pressure (!) 142/90, pulse 96, temperature 98.5 F (36.9 C), temperature source Oral, resp. rate 20, height 5' 6"  (1.676 m), weight 101.2 kg, last menstrual period 02/21/2012, SpO2 98 %.Body mass index is 35.99 kg/m.  General Appearance: Fairly Groomed  Eye Contact:  Fair  Speech:  Clear and Coherent and Normal Rate  Volume:  Normal  Mood:  Euthymic  Affect:  Appropriate and Congruent  Thought Process:  Linear and Descriptions of Associations: Intact  Orientation:  Full (Time, Place, and Person)  Thought Content:  WDL  Suicidal Thoughts:  No  Homicidal Thoughts:  No  Memory:  Immediate;   Fair Recent;   Fair  Judgement:  Intact  Insight:  Fair  Psychomotor Activity:  Normal  Concentration:  Concentration: Fair and Attention Span: Fair  Recall:  [employer] of Knowledge:  Fair  Language:  Fair  Akathisia:  No  Handed:  Right  AIMS (if indicated):     Assets:  Desire for Improvement Financial Resources/Insurance Housing Leisure Time Physical Health Social Support Talents/Skills Vocational/Educational  ADL's:  Intact  Cognition:  WNL  Sleep:  Number of  Hours: 6.75     Treatment Plan Summary: Daily contact with patient to assess and evaluate symptoms and progress in treatment and Medication management Depression/insomnia: Continue the Trazodone 50 mg  Anxiety/tension: Wellbutrin XL 3106m po daily, may initiate  Hydroxyzine 25 mg qid for mild anxiety. Continue to work with mindfulness, CBT help identify the cognitive distortions that keep the depression going.  Discuss other life style changes that can help with her depression such as exercise, meditation  Mood control: Will continue the Abilify 58m    Obtain spiritual consult, continue the antipsychotic medications.   TaNanci PinaFNP 04/12/2018, 10:52 AM

## 2018-04-13 MED ORDER — LORAZEPAM 0.5 MG PO TABS: 0.5000 mg | ORAL_TABLET | Freq: Four times a day (QID) | ORAL | 0 refills | Status: DC | PRN

## 2018-04-13 MED ORDER — BUPROPION HCL ER (XL) 300 MG PO TB24: 300.0000 mg | ORAL_TABLET | Freq: Every day | ORAL | 0 refills | Status: DC

## 2018-04-13 MED ORDER — DULOXETINE HCL 30 MG PO CPEP: 30.0000 mg | ORAL_CAPSULE | Freq: Every day | ORAL | 0 refills | Status: DC

## 2018-04-13 NOTE — Progress Notes (Signed)
  [facility]  Will you be returning to the same living situation after discharge:  Yes,  home with family At discharge, do you have transportation home?: Yes,  arranged by patient Do you have the ability to pay for your medications: Yes,  patient denies barriers  Release of information consent forms completed and turned in to Medical Records by CSW.   Patient to Follow up at: Follow-up Information    Dr. Maryln ManuelJane [facility] Follow up on 04/16/2018.   Why:  Wednesday at 8:00am for hospital follow up appointment. Referral resources for psychiatry: [facility] [phone] [facility] [phone] Contact information: [address], [place], [place] [address] [phone] F: [phone]           Next level of care provider [facility]:no  Safety Planning and Suicide Prevention discussed: No.  Declined by patient  Have you used any form of tobacco in the last 30 days? (Cigarettes, Smokeless Tobacco, Cigars, and/or Pipes): No  Has patient been referred to the Quitline?: N/A patient is not a smoker  Patient has been referred for addiction treatment: N/A  Lynnell ChadMareida J Grossman-Orr, LCSW 04/13/2018, 9:20 AM
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
## 2018-04-13 NOTE — [facility] Group Notes (Signed)
[facility] LCSW Group Therapy Note  Date/Time:  04/13/2018 9:00-10:00 or 10:00-11:00AM  Type of Therapy and Topic:  Group Therapy:  Healthy and Unhealthy Supports  Participation Level:  Active   Description of Group:  Patients in this group were introduced to the idea of adding a variety of healthy supports to address the various needs in their lives.Patients discussed what additional healthy supports could be helpful in their recovery and wellness after discharge in order to prevent future hospitalizations.   An emphasis was placed on using counselor, doctor, therapy groups, 12-step groups, and problem-specific support groups to expand supports.  They also worked as a group on developing a specific plan for several patients to deal with unhealthy supports through boundary-setting, psychoeducation with loved ones, and even termination of relationships.   Therapeutic Goals:   1)  discuss importance of adding supports to stay well once out of the hospital  2)  compare healthy versus unhealthy supports and identify some examples of each  3)  generate ideas and descriptions of healthy supports that can be added  4)  offer mutual support about how to address unhealthy supports  5)  encourage active participation in and adherence to discharge plan    Summary of Patient Progress:  The patient stated that current healthy supports in her life are her family excluding her husband, while current unhealthy supports include her husband who due to his own medical condition .  The patient expressed a willingness to add putting herself first as support(s) to help in her recovery journey.  She stated that she has recognized that if she does not take care of herself foremost she will not be available for others.  We also discussed if there would be more limited ways her husband could still provide support despite his medical condition, although it may not be the same as in the past.   Therapeutic Modalities:    Motivational Interviewing Brief Solution-Focused Therapy  Ambrose MantleMareida Grossman-Orr, LCSW

## 2018-04-13 NOTE — [facility] Suicide Risk Assessment (Signed)
[facility] Discharge Suicide Risk Assessment   Principal Problem:  Depression Discharge Diagnoses:  Patient Active Problem List   Diagnosis Date Noted  . MDD (major depressive disorder), recurrent severe, without psychosis (HCC) [F33.2] 04/10/2018  . Dyspnea on exertion [R06.09] 11/27/2017  . Chronic cough [R05] 11/26/2017  . Otitis media [H66.90] 11/26/2017  . Meralgia paresthetica [G57.10] 03/30/2016  . 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 Diffuse cystic mastopathy [N60.19] 06/15/2013    Total Time spent with patient: 30 minutes  Musculoskeletal: Strength & Muscle Tone: within normal limits Gait & Station: normal Patient leans: N/A  Psychiatric Specialty Exam: ROS no headache, no chest pain, no shortness of breath, no vomiting   Blood pressure (!) 142/90, pulse 96, temperature 98.5 F (36.9 C), temperature source Oral, resp. rate 20, height 5\' 6"  (1.676 m), weight 101.2 kg, last menstrual period 02/21/2012, SpO2 98 %.Body mass index is 35.99 kg/m.  General Appearance: Well Groomed  Eye Contact::  Good  Speech:  Normal Rate409  Volume:  Normal  Mood:  improving mood - states she is feeling " a lot better"  Affect:  Appropriate and more reactive   Thought Process:  Linear and Descriptions of Associations: Intact  Orientation:  Full (Time, Place, and Person)  Thought Content:  denies suicidal or self injurious ideations, no homicidal or violent ideations  Suicidal Thoughts:  No denies suicidal or self injurious ideations, denies homicidal or violent ideations   Homicidal Thoughts:  No  Memory:  recent and remote grossly intact   Judgement:  Other:  improving   Insight:  improving  Psychomotor Activity:  Normal  Concentration:  Good  Recall:  Good  Fund of Knowledge:Good  Language: Good  Akathisia:  Negative  Handed:  Right  AIMS (if indicated):     Assets:  Communication Skills Desire for Improvement Resilience  Sleep:  Number of Hours: 6.75  Cognition:  WNL  ADL's:  Intact   Mental Status Per Nursing Assessment::   On Admission:  Suicidal ideation indicated by patient  Demographic Factors:  22, married, has one adult daughter, employed   Loss Factors: Currently on short term disability, marital stressors, states she did not tolerate recent psychiatric medication trial ( lithium) well   Historical Factors: One prior psychiatric admission many years ago after brother passed away, no history of suicide attempts,  Reports recent lithium trial was not well tolerated, feels that this medication caused worsening symptoms.  Risk Reduction Factors:   Sense of responsibility to family, Living with another person, especially a relative, Positive social support, Positive coping skills or problem solving skills and states she is invested in her pets   Continued Clinical Symptoms:  At this time patient is alert and attentive, calm, reports improving mood , presents with a more reactive affect, smiles at times appropriately, no thought disorder, no suicidal ideations, no homicidal ideations, no hallucinations, no delusions . Future oriented, states she is thinking of resigning from her current job and taking a part time job. Denies medication side effects, and states she has been feeling " a lot better since I stopped lithium. I feel like I am back to my old self ". Behavior on unit calm and in good control, pleasant on approach.  Cognitive Features That Contribute To Risk:  No gross cognitive deficits noted upon discharge. Is alert , attentive, and oriented x 3   Suicide Risk:  Mild:  Suicidal ideation of limited frequency, intensity, duration, and specificity.  There are no identifiable  plans, no associated intent, mild dysphoria and related symptoms, good self-control (both objective and subjective assessment), few other risk factors, and identifiable protective factors, including available and accessible social support.  Follow-up Information     Dr. Maryln ManuelJane Rosen-Grandon Follow up on 04/16/2018.   Why:  Wednesday at 8:00am for hospital follow up appointment. Referral resources for psychiatry: [facility] [phone] [facility] [phone] Contact information: [address], [place], [place] [address] [phone] F: [phone]           Plan Of Care/Follow-up recommendations:  Activity:  as tolerated  Diet:  Regular Tests:  NA Other:  See below Patient is requesting discharge and has submitted letter requesting to be discharged- there are no grounds for involuntary commitment . She is leaving unit in good spirits. Plans to return home. Plans to follow up as above . She also has an established PCP for medical management as needed . Craige CottaFernando A Farhaan Mabee, MD 04/13/2018, 9:19 AM

## 2018-04-13 NOTE — Plan of Care (Signed)
Discharge note  Patient verbalizes readiness for discharge. Follow up plan explained, AVS, Transition record and SRA given. Prescriptions and teaching provided. Belongings returned and signed for. Suicide safety plan completed and signed. Patient verbalizes understanding. Patient denies SI/HI and assures this Clinical research associatewriter he will seek assistance should that change. Patient discharged to lobby where patient is driving herself home.  Problem: Education: Goal: Knowledge of [place] Education information/materials will improve Outcome: Adequate for Discharge Goal: Emotional status will improve Outcome: Adequate for Discharge Goal: Mental status will improve Outcome: Adequate for Discharge Goal: Verbalization of understanding the information provided will improve Outcome: Adequate for Discharge   Problem: Activity: Goal: Interest or engagement in activities will improve Outcome: Adequate for Discharge Goal: Sleeping patterns will improve Outcome: Adequate for Discharge   Problem: Coping: Goal: Ability to verbalize frustrations and anger appropriately will improve Outcome: Adequate for Discharge Goal: Ability to demonstrate self-control will improve Outcome: Adequate for Discharge   Problem: Health Behavior/Discharge Planning: Goal: Identification of resources available to assist in meeting health care needs will improve Outcome: Adequate for Discharge Goal: Compliance with treatment plan for underlying cause of condition will improve Outcome: Adequate for Discharge   Problem: Physical Regulation: Goal: Ability to maintain clinical measurements within normal limits will improve Outcome: Adequate for Discharge   Problem: Safety: Goal: Periods of time without injury will increase Outcome: Adequate for Discharge   Problem: Education: Goal: Utilization of techniques to improve thought processes will improve Outcome: Adequate for Discharge Goal: Knowledge of the prescribed  therapeutic regimen will improve Outcome: Adequate for Discharge   Problem: Activity: Goal: Interest or engagement in leisure activities will improve Outcome: Adequate for Discharge Goal: Imbalance in normal sleep/wake cycle will improve Outcome: Adequate for Discharge   Problem: Coping: Goal: Coping ability will improve Outcome: Adequate for Discharge Goal: Will verbalize feelings Outcome: Adequate for Discharge   Problem: Health Behavior/Discharge Planning: Goal: Ability to make decisions will improve Outcome: Adequate for Discharge Goal: Compliance with therapeutic regimen will improve Outcome: Adequate for Discharge   Problem: Role Relationship: Goal: Will demonstrate positive changes in social behaviors and relationships Outcome: Adequate for Discharge   Problem: Safety: Goal: Ability to disclose and discuss suicidal ideas will improve Outcome: Adequate for Discharge Goal: Ability to identify and utilize support systems that promote safety will improve Outcome: Adequate for Discharge   Problem: Self-Concept: Goal: Will verbalize positive feelings about self Outcome: Adequate for Discharge Goal: Level of anxiety will decrease Outcome: Adequate for Discharge   Problem: Activity: Goal: Will identify at least one activity in which they can participate Outcome: Adequate for Discharge   Problem: Coping: Goal: Ability to identify and develop effective coping behavior will improve Outcome: Adequate for Discharge Goal: Ability to interact with others will improve Outcome: Adequate for Discharge Goal: Demonstration of participation in decision-making regarding own care will improve Outcome: Adequate for Discharge Goal: Ability to use eye contact when communicating with others will improve Outcome: Adequate for Discharge   Problem: Health Behavior/Discharge Planning: Goal: Identification of resources available to assist in meeting health care needs will improve Outcome:  Adequate for Discharge   Problem: Self-Concept: Goal: Will verbalize positive feelings about self Outcome: Adequate for Discharge   Problem: Education: Goal: Ability to make informed decisions regarding treatment will improve Outcome: Adequate for Discharge   Problem: Coping: Goal: Coping ability will improve Outcome: Adequate for Discharge   Problem: Health Behavior/Discharge Planning: Goal: Identification of resources available to assist in meeting health care needs will improve  Outcome: Adequate for Discharge   Problem: Medication: Goal: Compliance with prescribed medication regimen will improve Outcome: Adequate for Discharge   Problem: Self-Concept: Goal: Ability to disclose and discuss suicidal ideas will improve Outcome: Adequate for Discharge Goal: Will verbalize positive feelings about self Outcome: Adequate for Discharge

## 2018-05-09 ENCOUNTER — Other Ambulatory Visit (HOSPITAL_COMMUNITY): Payer: Self-pay | Admitting: Family

## 2018-05-26 ENCOUNTER — Other Ambulatory Visit: Payer: Self-pay | Admitting: Internal Medicine

## 2018-05-26 DIAGNOSIS — Z1231 Encounter for screening mammogram for malignant neoplasm of breast: Secondary | ICD-10-CM

## 2018-06-05 ENCOUNTER — Telehealth: Payer: Self-pay | Admitting: Neurology

## 2018-06-05 NOTE — Telephone Encounter (Signed)
Will work on authorization and let patient know.

## 2018-06-05 NOTE — Telephone Encounter (Signed)
Patient came by and gave her new insurance card the [facility] 2019. She wants to know if they will approve her Botox appt in Nov. Please call her at [phone]. Thanks!

## 2018-06-06 NOTE — Telephone Encounter (Signed)
Botox auth submitted via covermymeds.

## 2018-06-11 ENCOUNTER — Telehealth: Payer: Self-pay

## 2018-06-11 NOTE — Telephone Encounter (Signed)
Received notice from [employer] that pts BOTOX has been approved thru 09/06/2018  PA Case# [id number]

## 2018-06-13 NOTE — Progress Notes (Deleted)
62 y.o. G63P1041 Married Caucasian female here for annual exam.    PCP:     Patient's last menstrual period was 02/21/2012 (exact date).           Sexually active: {yes no:314532}  The current method of family planning is post menopausal status.    Exercising: {yes no:314532}  {types:19826} Smoker:  no  Health Maintenance: Pap:  05/10/16 Neg:Neg HR HPV, 03-23-13 Neg:Neg HR HPV History of abnormal Pap:  no MMG: 06-19-17 Neg/Density C/BiRads1--APPT 06-27-18 Colonoscopy: 07-14-13 diverticulosis;next due 2024 BMD:***2013   Result: normal TDaP: *** Gardasil:   no HIV:*** Hep C:*** Screening Labs:  Hb today: ***, Urine today: ***   reports that she has never smoked. She has never used smokeless tobacco. She reports that she does not drink alcohol or use drugs.  Past Medical History:  Diagnosis Date  . Abnormal uterine bleeding 03-2012   PMB/ Hysteroscopic Polyp resection  . Allergy   . Anxiety   . Depression   . Diffuse cystic mastopathy 2013  . GERD (gastroesophageal reflux disease) 2009   otc medicine  . Headache(784.0)   . Hypertension    controlled with metoprolol  . Hypothyroidism 2011  . Obesity, unspecified 2013  . Pneumonia 2011  . Shoulder fracture, left   . Special screening for malignant neoplasms, colon 2012    Past Surgical History:  Procedure Laterality Date  . BREAST BIOPSY Left 2004   fibrosis  . BREAST CYST ASPIRATION Bilateral   . BREAST SURGERY Right 06-2007   Right breast biopsy/ ductal hyperplasia without atypia  . CESAREAN SECTION  1994  . COLONOSCOPY  [id number]   Dr. Evette Cristal  . DILATION AND CURETTAGE OF UTERUS    . EXTERNAL EAR SURGERY  2005   ? type  . HYSTEROSCOPY  03/2012   resection of endometrial polyps  . NASAL SINUS SURGERY  1988  . TONSILLECTOMY    . TRACHEOSTOMY  2011    Current Outpatient Medications  Medication Sig Dispense Refill  . buPROPion (WELLBUTRIN XL) 300 MG 24 hr tablet Take 1 tablet (300 mg total) by mouth daily. 30  tablet 0  . dexlansoprazole (DEXILANT) 60 MG capsule Take 1 capsule (60 mg total) by mouth daily. 30 capsule 1  . DULoxetine (CYMBALTA) 30 MG capsule Take 1 capsule (30 mg total) by mouth daily. 30 capsule 0  . LORazepam (ATIVAN) 0.5 MG tablet Take 1 tablet (0.5 mg total) by mouth every 6 (six) hours as needed for anxiety or sleep. 10 tablet 0  . omeprazole (PRILOSEC) 40 MG capsule Take 40 mg by mouth daily.     No current facility-administered medications for this visit.     Family History  Problem Relation Age of Onset  . Diabetes Brother   . Crohn's disease Mother   . Hypertension Mother   . Hypertension Father   . Diabetes Brother   . Healthy Daughter   . Breast cancer Neg Hx     Review of Systems  Exam:   LMP 02/21/2012 (Exact Date)     General appearance: alert, cooperative and appears stated age Head: Normocephalic, without obvious abnormality, atraumatic Neck: no adenopathy, supple, symmetrical, trachea midline and thyroid normal to inspection and palpation Lungs: clear to auscultation bilaterally Breasts: normal appearance, no masses or tenderness, No nipple retraction or dimpling, No nipple discharge or bleeding, No axillary or supraclavicular adenopathy Heart: regular rate and rhythm Abdomen: soft, non-tender; no masses, no organomegaly Extremities: extremities normal, atraumatic, no cyanosis or edema Skin:  Skin color, texture, turgor normal. No rashes or lesions Lymph nodes: Cervical, supraclavicular, and axillary nodes normal. No abnormal inguinal nodes palpated Neurologic: Grossly normal  Pelvic: External genitalia:  no lesions              Urethra:  normal appearing urethra with no masses, tenderness or lesions              Bartholins and Skenes: normal                 Vagina: normal appearing vagina with normal color and discharge, no lesions              Cervix: no lesions              Pap taken: {yes no:314532} Bimanual Exam:  Uterus:  normal size,  contour, position, consistency, mobility, non-tender              Adnexa: no mass, fullness, tenderness              Rectal exam: {yes no:314532}.  Confirms.              Anus:  normal sphincter tone, no lesions  Chaperone was present for exam.  Assessment:   Well woman visit with normal exam.   Plan: Mammogram screening. Recommended self breast awareness. Pap and HR HPV as above. Guidelines for Calcium, Vitamin D, regular exercise program including cardiovascular and weight bearing exercise.   Follow up annually and prn.   Additional counseling given.  {yes [id number]. _______ minutes face to face time of which over 50% was spent in counseling.    After visit summary provided.

## 2018-06-18 ENCOUNTER — Ambulatory Visit: Payer: Managed Care, Other (non HMO) | Admitting: Obstetrics and Gynecology

## 2018-06-27 ENCOUNTER — Ambulatory Visit
Admission: RE | Admit: 2018-06-27 | Discharge: 2018-06-27 | Disposition: A | Discharge status: Home / Self Care | Payer: Commercial Managed Care - PPO | Source: Ambulatory Visit | Attending: Internal Medicine | Admitting: Internal Medicine

## 2018-06-27 DIAGNOSIS — Z1231 Encounter for screening mammogram for malignant neoplasm of breast: Secondary | ICD-10-CM

## 2018-07-03 NOTE — Progress Notes (Signed)
62 y.o. G72P1041 Married Caucasian female here for annual exam.    Hospitalized for depression this summer.  Was suicidal and this was attributed to some of her medication, which has since been discontinued.  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   Having urinary incontinence.  Occurring for several years.  Has seen urology in [facility].  Tried medication.  Maybe Vesicare.  DF - every 3 - 4 hours.  NF - couple times per night.  Cannot get to the bathroom on time.  Little bit of leakage with cough or laugh.  Seldom episodes of fecal incontinence.  Some salad can cause fecal urgency.   Denies hx of glaucoma or cardiac arrhythmia.   Will be having cataract surgery.   Retired and working part time now.  Doing some remodeling at home.   PCP: Raechel Chute, MD    Patient's last menstrual period was 02/21/2012 (exact date).           Sexually active: No.  The current method of family planning is post menopausal status.  Exercising: Yes.    walking Smoker:  no  Health Maintenance: Pap: 05-10-16 Neg:Neg HR HPV, 03-23-13 Neg:Neg HR HPV History of abnormal Pap:  no MMG: 06-27-18 3D Neg/density C/BiRads1 Colonoscopy:   07-14-13 diverticulosis;next due 2024 BMD: 2013  Result :normal TDaP:  PCP Gardasil:   no HIV:no Hep C: Unsure Screening Labs:  Hb today: PCP Flu vaccine done:  Done    reports that she has never smoked. She has never used smokeless tobacco. She reports that she does not drink alcohol or use drugs.  Past Medical History:  Diagnosis Date  . Abnormal uterine bleeding 03-2012   PMB/ Hysteroscopic Polyp resection  . Allergy   . Anxiety   . Depression   . Diffuse cystic mastopathy 2013  . GERD (gastroesophageal reflux disease) 2009   otc medicine  . Headache(784.0)   . Hypertension    controlled with metoprolol  . Hypothyroidism 2011  . Obesity, unspecified 2013  . Pneumonia 2011  . Shoulder fracture, left   . Special screening for malignant  neoplasms, colon 2012    Past Surgical History:  Procedure Laterality Date  . BREAST BIOPSY Left 2004   fibrosis  . BREAST CYST ASPIRATION Bilateral   . BREAST SURGERY Right 06-2007   Right breast biopsy/ ductal hyperplasia without atypia  . CESAREAN SECTION  1994  . COLONOSCOPY  [id number]   Dr. Evette Cristal  . DILATION AND CURETTAGE OF UTERUS    . EXTERNAL EAR SURGERY  2005   ? type  . HYSTEROSCOPY  03/2012   resection of endometrial polyps  . NASAL SINUS SURGERY  1988  . TONSILLECTOMY    . TRACHEOSTOMY  2011    Current Outpatient Medications  Medication Sig Dispense Refill  . ARIPiprazole (ABILIFY) 10 MG tablet Take 10 mg by mouth daily.    Marland Kitchen buPROPion (WELLBUTRIN XL) 300 MG 24 hr tablet Take 1 tablet (300 mg total) by mouth daily. 30 tablet 0  . DULoxetine (CYMBALTA) 30 MG capsule Take 1 capsule (30 mg total) by mouth daily. 30 capsule 0  . hydrOXYzine (ATARAX/VISTARIL) 25 MG tablet Take 1 tablet by mouth as needed.  0  . nystatin ointment (MYCOSTATIN) Apply 1 application topically as needed.  0  . omeprazole (PRILOSEC) 40 MG capsule Take 40 mg by mouth daily.    Marland Kitchen PREVIDENT 5000 SENSITIVE 1.1-5 % PSTE U PRN  0   No current facility-administered medications for this  visit.     Family History  Problem Relation Age of Onset  . Diabetes Brother   . Crohn's disease Mother   . Hypertension Mother   . Hypertension Father   . Diabetes Brother   . Healthy Daughter   . Breast cancer Neg Hx     Review of Systems  All other systems reviewed and are negative.   Exam:   BP 122/60   Pulse 88   Resp 20   Ht 5\' 6"  (1.676 m)   Wt 236 lb (107 kg)   LMP 02/21/2012 (Exact Date)   BMI 38.09 kg/m     General appearance: alert, cooperative and appears stated age Head: Normocephalic, without obvious abnormality, atraumatic Neck: no adenopathy, supple, symmetrical, trachea midline and thyroid normal to inspection and palpation Lungs: clear to auscultation bilaterally Breasts:  normal appearance, no masses or tenderness, No nipple retraction or dimpling, No nipple discharge or bleeding, No axillary or supraclavicular adenopathy Heart: regular rate and rhythm Abdomen: soft, non-tender; no masses, no organomegaly Extremities: extremities normal, atraumatic, no cyanosis or edema Skin: Skin color, texture, turgor normal. No rashes or lesions Lymph nodes: Cervical, supraclavicular, and axillary nodes normal. No abnormal inguinal nodes palpated Neurologic: Grossly normal  Pelvic: External genitalia:  no lesions              Urethra:  normal appearing urethra with no masses, tenderness or lesions              Bartholins and Skenes: normal                 Vagina: normal appearing vagina with normal color and discharge, no lesions              Cervix: no lesions              Pap taken: No. Bimanual Exam:  Uterus:  normal size, contour, position, consistency, mobility, non-tender              Adnexa: no mass, fullness, tenderness              Rectal exam: Yes.  .  Confirms.              Anus:  normal sphincter tone, no lesions  Chaperone was present for exam.  Assessment:   Well woman visit with normal exam. Hx breast ductal hyperplasia. Overactive bladder.   Plan: Mammogram screening. Recommended self breast awareness. Pap and HR HPV as above. Guidelines for Calcium, Vitamin D, regular exercise program including cardiovascular and weight bearing exercise. Ditropan XL 10 mg daily. #30, RF one.  I discussed side effects of dry mouth, constipation, and urinary retention.  She will call to let me know if the medication is working or not.  Follow up annually and prn.    After visit summary provided.

## 2018-07-04 ENCOUNTER — Ambulatory Visit (INDEPENDENT_AMBULATORY_CARE_PROVIDER_SITE_OTHER): Payer: Commercial Managed Care - PPO | Admitting: Neurology

## 2018-07-04 ENCOUNTER — Other Ambulatory Visit: Payer: Self-pay

## 2018-07-04 ENCOUNTER — Ambulatory Visit: Payer: Commercial Managed Care - PPO | Admitting: Obstetrics and Gynecology

## 2018-07-04 ENCOUNTER — Encounter: Payer: Self-pay | Admitting: Obstetrics and Gynecology

## 2018-07-04 VITALS — BP 122/60 | HR 88 | Resp 20 | Ht 66.0 in | Wt 236.0 lb

## 2018-07-04 DIAGNOSIS — Z01419 Encounter for gynecological examination (general) (routine) without abnormal findings: Secondary | ICD-10-CM

## 2018-07-04 DIAGNOSIS — G245 Blepharospasm: Secondary | ICD-10-CM | POA: Diagnosis not present

## 2018-07-04 DIAGNOSIS — N3281 Overactive bladder: Secondary | ICD-10-CM

## 2018-07-04 MED ORDER — ONABOTULINUMTOXINA 100 UNITS IJ SOLR
40.0000 [IU] | Freq: Once | INTRAMUSCULAR | Status: AC
  Administered 2018-07-04: 40 [IU] via INTRAMUSCULAR

## 2018-07-04 MED ORDER — OXYBUTYNIN CHLORIDE ER 10 MG PO TB24: ORAL_TABLET | ORAL | 1 refills | Status: DC

## 2018-07-04 NOTE — Procedures (Signed)
[facility]   Procedure Note Botox  Attending: Dr. Rebecca Tat  Preoperative Diagnosis(es): Blepharospasm; meige syndrome  Clinical comment:  Pt pleased with efficacy of botox without SE.     Consent obtained from: The patient Benefits discussed included, but were not limited to ability to open eyes and therefore see better.  Risk discussed included, but were not limited pain and discomfort, bleeding, bruising, excessive weakness, venous thrombosis, muscle atrophy and drooping of eyelids.  A copy of the patient medication guide was given to the patient which explains the blackbox warning.  Patients identity and treatment sites confirmed Yes.  .  Details of Procedure: Skin was cleaned with alcohol.  A 30 gauge, 1/2 inch needle was introduced to the target muscle.  Prior to injection, the needle plunger was aspirated to make sure the needle was not within a blood vessel.  There was no blood retrieved on aspiration.    Following is a summary of the muscles injected  And the amount of Botulinum toxin used:   Dilution 0.9% preservative free saline mixed with 100 u Botox type A to make 5 U per 0.1cc (2 cc of saline used per 100 U botox)  Injections  Location Left  Right Units  Upper lateral orbicularis oculi  2.5 2.5 5.0  Upper medial orbicularis oculi  2.5 2.5 5.0  Lateral orbicularis oculi  5.0 5.0 10  Lower lateral orbicularis oculi  2.5 2.5 5.0  Lower medial orbicularis oculi 2.5 2.5 5.0  Frontalis 2.5/2.5 2.5/2.5 10       TOTAL UNITS:   40   Agent: Botulinum Type A ( Onobotulinum Toxin type A ).  1 vials of Botox were used, each containing 50 units and freshly diluted with 2 mL of sterile, non-perserved saline   Total injected (Units): 40  Total wasted (Units): 0   Pt tolerated procedure well without complications.   Reinjection is anticipated in 3 months. 

## 2018-07-04 NOTE — Patient Instructions (Signed)
EXERCISE AND DIET:  We recommended that you start or continue a regular exercise program for good health. Regular exercise means any activity that makes your heart beat faster and makes you sweat.  We recommend exercising at least 30 minutes per day at least 3 days a week, preferably 4 or 5.  We also recommend a diet low in fat and sugar.  Inactivity, poor dietary choices and obesity can cause diabetes, heart attack, stroke, and kidney damage, among others.    ALCOHOL AND SMOKING:  Women should limit their alcohol intake to no more than 7 drinks/beers/glasses of wine (combined, not each!) per week. Moderation of alcohol intake to this level decreases your risk of breast cancer and liver damage. And of course, no recreational drugs are part of a healthy lifestyle.  And absolutely no smoking or even second hand smoke. Most people know smoking can cause heart and lung diseases, but did you know it also contributes to weakening of your bones? Aging of your skin?  Yellowing of your teeth and nails?  CALCIUM AND VITAMIN D:  Adequate intake of calcium and Vitamin D are recommended.  The recommendations for exact amounts of these supplements seem to change often, but generally speaking 600 mg of calcium (either carbonate or citrate) and 800 units of Vitamin D per day seems prudent. Certain women may benefit from higher intake of Vitamin D.  If you are among these women, your doctor will have told you during your visit.    PAP SMEARS:  Pap smears, to check for cervical cancer or precancers,  have traditionally been done yearly, although recent scientific advances have shown that most women can have pap smears less often.  However, every woman still should have a physical exam from her gynecologist every year. It will include a breast check, inspection of the vulva and vagina to check for abnormal growths or skin changes, a visual exam of the cervix, and then an exam to evaluate the size and shape of the uterus and  ovaries.  And after 63 years of age, a rectal exam is indicated to check for rectal cancers. We will also provide age appropriate advice regarding health maintenance, like when you should have certain vaccines, screening for sexually transmitted diseases, bone density testing, colonoscopy, mammograms, etc.   MAMMOGRAMS:  All women over 40 years old should have a yearly mammogram. Many facilities now offer a "3D" mammogram, which may cost around $50 extra out of pocket. If possible,  we recommend you accept the option to have the 3D mammogram performed.  It both reduces the number of women who will be called back for extra views which then turn out to be normal, and it is better than the routine mammogram at detecting truly abnormal areas.    COLONOSCOPY:  Colonoscopy to screen for colon cancer is recommended for all women at age 50.  We know, you hate the idea of the prep.  We agree, BUT, having colon cancer and not knowing it is worse!!  Colon cancer so often starts as a polyp that can be seen and removed at colonscopy, which can quite literally save your life!  And if your first colonoscopy is normal and you have no family history of colon cancer, most women don't have to have it again for 10 years.  Once every ten years, you can do something that may end up saving your life, right?  We will be happy to help you get it scheduled when you are ready.    Be sure to check your insurance coverage so you understand how much it will cost.  It may be covered as a preventative service at no cost, but you should check your particular policy.     Oxybutynin extended-release tablets What is this medicine? OXYBUTYNIN (ox i BYOO ti nin) is used to treat overactive bladder. This medicine reduces the amount of bathroom visits. It may also help to control wetting accidents. This medicine may be used for other purposes; ask your health care provider or pharmacist if you have questions. COMMON BRAND NAME(S): Ditropan  XL What should I tell my health care provider before I take this medicine? They need to know if you have any of these conditions: -autonomic neuropathy -dementia -difficulty passing urine -glaucoma -intestinal obstruction -kidney disease -liver disease -myasthenia gravis -Parkinson's disease -an unusual or allergic reaction to oxybutynin, other medicines, foods, dyes, or preservatives -pregnant or trying to get pregnant -breast-feeding How should I use this medicine? Take this medicine by mouth with a glass of water. Swallow whole, do not crush, cut, or chew. Follow the directions on the prescription label. You can take this medicine with or without food. Take your doses at regular intervals. Do not take your medicine more often than directed. Talk to your pediatrician regarding the use of this medicine in children. Special care may be needed. While this drug may be prescribed for children as young as 6 years for selected conditions, precautions do apply. Overdosage: If you think you have taken too much of this medicine contact a poison control center or emergency room at once. NOTE: This medicine is only for you. Do not share this medicine with others. What if I miss a dose? If you miss a dose, take it as soon as you can. If it is almost time for your next dose, take only that dose. Do not take double or extra doses. What may interact with this medicine? -antihistamines for allergy, cough and cold -atropine -certain medicines for bladder problems like oxybutynin, tolterodine -certain medicines for Parkinson's disease like benztropine, trihexyphenidyl -certain medicines for stomach problems like dicyclomine, hyoscyamine -certain medicines for travel sickness like scopolamine -clarithromycin -erythromycin -ipratropium -medicines for fungal infections, like fluconazole, itraconazole, ketoconazole or voriconazole This list may not describe all possible interactions. Give your health  care provider a list of all the medicines, herbs, non-prescription drugs, or dietary supplements you use. Also tell them if you smoke, drink alcohol, or use illegal drugs. Some items may interact with your medicine. What should I watch for while using this medicine? It may take a few weeks to notice the full benefit from this medicine. You may need to limit your intake tea, coffee, caffeinated sodas, and alcohol. These drinks may make your symptoms worse. You may get drowsy or dizzy. Do not drive, use machinery, or do anything that needs mental alertness until you know how this medicine affects you. Do not stand or sit up quickly, especially if you are an older patient. This reduces the risk of dizzy or fainting spells. Alcohol may interfere with the effect of this medicine. Avoid alcoholic drinks. Your mouth may get dry. Chewing sugarless gum or sucking hard candy, and drinking plenty of water may help. Contact your doctor if the problem does not go away or is severe. This medicine may cause dry eyes and blurred vision. If you wear contact lenses, you may feel some discomfort. Lubricating drops may help. See your eyecare professional if the problem does not go away or is   severe. You may notice the shells of the tablets in your stool from time to time. This is normal. Avoid extreme heat. This medicine can cause you to sweat less than normal. Your body temperature could increase to dangerous levels, which may lead to heat stroke. What side effects may I notice from receiving this medicine? Side effects that you should report to your doctor or health care professional as soon as possible: -allergic reactions like skin rash, itching or hives, swelling of the face, lips, or tongue -agitation -breathing problems -confusion -fever -flushing (reddening of the skin) -hallucinations -memory loss -pain or difficulty passing urine -palpitations -unusually weak or tired Side effects that usually do not  require medical attention (report to your doctor or health care professional if they continue or are bothersome): -constipation -headache -sexual difficulties (impotence) This list may not describe all possible side effects. Call your doctor for medical advice about side effects. You may report side effects to FDA at 1-800-FDA-1088. Where should I keep my medicine? Keep out of the reach of children. Store at room temperature between 15 and 30 degrees C (59 and 86 degrees F). Protect from moisture and humidity. Throw away any unused medicine after the expiration date. NOTE: This sheet is a summary. It may not cover all possible information. If you have questions about this medicine, talk to your doctor, pharmacist, or health care provider.  2018 Elsevier/Gold Standard (2013-10-22 10:57:06)  Overactive Bladder, Adult Overactive bladder is a group of urinary symptoms. With overactive bladder, you may suddenly feel the need to pass urine (urinate) right away. After feeling this sudden urge, you might also leak urine if you cannot get to the bathroom fast enough (urinary incontinence). These symptoms might interfere with your daily work or social activities. Overactive bladder symptoms may also wake you up at night. Overactive bladder affects the nerve signals between your bladder and your brain. Your bladder may get the signal to empty before it is full. Very sensitive muscles can also make your bladder squeeze too soon. What are the causes? Many things can cause an overactive bladder. Possible causes include:  Urinary tract infection.  Infection of nearby tissues, such as the prostate.  Prostate enlargement.  Being pregnant with twins or more (multiples).  Surgery on the uterus or urethra.  Bladder stones, inflammation, or tumors.  Drinking too much caffeine or alcohol.  Certain medicines, especially those that you take to help your body get rid of extra fluid (diuretics) by increasing  urine production.  Muscle or nerve weakness, especially from: ? A spinal cord injury. ? Stroke. ? Multiple sclerosis. ? Parkinson disease.  Diabetes. This can cause a high urine volume that fills the bladder so quickly that the normal urge to urinate is triggered very strongly.  Constipation. A buildup of too much stool can put pressure on your bladder.  What increases the risk? You may be at greater risk for overactive bladder if you:  Are an older adult.  Smoke.  Are going through menopause.  Have prostate problems.  Have a neurological disease, such as stroke, dementia, Parkinson disease, or multiple sclerosis (MS).  Eat or drink things that irritate the bladder. These include alcohol, spicy food, and caffeine.  Are overweight or obese.  What are the signs or symptoms? The signs and symptoms of an overactive bladder include:  Sudden, strong urges to urinate.  Leaking urine.  Urinating eight or more times per day.  Waking up to urinate two or more times per night.    How is this diagnosed? Your health care provider may suspect overactive bladder based on your symptoms. The health care provider will do a physical exam and take your medical history. Blood or urine tests may also be done. For example, you might need to have a bladder function test to check how well you can hold your urine. You might also need to see a health care provider who specializes in the urinary tract (urologist). How is this treated? Treatment for overactive bladder depends on the cause of your condition and whether it is mild or severe. Certain treatments can be done in your health care provider's office or clinic. You can also make lifestyle changes at home. Options include: Behavioral Treatments  Biofeedback. A specialist uses sensors to help you become aware of your body's signals.  Keeping a daily log of when you need to urinate and what happens after the urge. This may help you manage your  condition.  Bladder training. This helps you learn to control the urge to urinate by following a schedule that directs you to urinate at regular intervals (timed voiding). At first, you might have to wait a few minutes after feeling the urge. In time, you should be able to schedule bathroom visits an hour or more apart.  Kegel exercises. These are exercises to strengthen the pelvic floor muscles, which support the bladder. Toning these muscles can help you control urination, even if your bladder muscles are overactive. A specialist will teach you how to do these exercises correctly. They require daily practice.  Weight loss. If you are obese or overweight, losing weight might relieve your symptoms of overactive bladder. Talk to your health care provider about losing weight and whether there is a specific program or method that would work best for you.  Diet change. This might help if constipation is making your overactive bladder worse. Your health care provider or a dietitian can explain ways to change what you eat to ease constipation. You might also need to consume less alcohol and caffeine or drink other fluids at different times of the day.  Stopping smoking.  Wearing pads to absorb leakage while you wait for other treatments to take effect. Physical Treatments  Electrical stimulation. Electrodes send gentle pulses of electricity to strengthen the nerves or muscles that help to control the bladder. Sometimes, the electrodes are placed outside of the body. In other cases, they might be placed inside the body (implanted). This treatment can take several months to have an effect.  Supportive devices. Women may need a plastic device that fits into the vagina and supports the bladder (pessary). Medicines Several medicines can help treat overactive bladder and are usually used along with other treatments. Some are injected into the muscles involved in urination. Others come in pill form. Your  health care provider may prescribe:  Antispasmodics. These medicines block the signals that the nerves send to the bladder. This keeps the bladder from releasing urine at the wrong time.  Tricyclic antidepressants. These types of antidepressants also relax bladder muscles.  Surgery  You may have a device implanted to help manage the nerve signals that indicate when you need to urinate.  You may have surgery to implant electrodes for electrical stimulation.  Sometimes, very severe cases of overactive bladder require surgery to change the shape of the bladder. Follow these instructions at home:  Take medicines only as directed by your health care provider.  Use any implants or a pessary as directed by your health care   provider.  Make any diet or lifestyle changes that are recommended by your health care provider. These might include: ? Drinking less fluid or drinking at different times of the day. If you need to urinate often during the night, you may need to stop drinking fluids early in the evening. ? Cutting down on caffeine or alcohol. Both can make an overactive bladder worse. Caffeine is found in coffee, tea, and sodas. ? Doing Kegel exercises to strengthen muscles. ? Losing weight if you need to. ? Eating a healthy and balanced diet to prevent constipation.  Keep a journal or log to track how much and when you drink and also when you feel the need to urinate. This will help your health care provider to monitor your condition. Contact a health care provider if:  Your symptoms do not get better after treatment.  Your pain and discomfort are getting worse.  You have more frequent urges to urinate.  You have a fever. Get help right away if: You are not able to control your bladder at all. This information is not intended to replace advice given to you by your health care provider. Make sure you discuss any questions you have with your health care provider. Document Released:  06/02/2009 Document Revised: 01/12/2016 Document Reviewed: 12/30/2013 Elsevier Interactive Patient Education  2018 Elsevier Inc.  

## 2018-09-23 ENCOUNTER — Other Ambulatory Visit: Payer: Self-pay | Admitting: Obstetrics and Gynecology

## 2018-09-23 NOTE — Telephone Encounter (Signed)
Medication refill request: Oxybutynin Last AEX:  07/04/18 BS Next AEX: 07/08/19 Last MMG (if hormonal medication request): 06/27/18 BIRADS 1 negative/density c Refill authorized: 07/04/18 #30 w/1 refills; Order pended for #30 w/1 refill if authorized.

## 2018-10-02 ENCOUNTER — Telehealth: Payer: Self-pay | Admitting: Neurology

## 2018-10-02 NOTE — Telephone Encounter (Signed)
Patient made aware. She will call me back with new insurance information. Need insurance name. ID number. Group number. RX BIN/PCN/Group. Phone number.  Will await call back to get this information.

## 2018-10-02 NOTE — Telephone Encounter (Signed)
Left message on machine for patient to call back.  To see if we can get her updated insurance information, I tried to submit Botox authorization and it states benefits terminated. Awaiting call back to discuss.

## 2018-10-03 NOTE — Telephone Encounter (Signed)
Will submit prior authorization.   Thanks so much.

## 2018-10-03 NOTE — Telephone Encounter (Signed)
Insurance info: [employer]/ ID #[id number]/ Group [id number] [employer] phone #: customer serv [phone] and  Provider serv [phone]           I also put this info in the system. Thanks! She did not have the RX group info.

## 2018-10-13 ENCOUNTER — Telehealth: Payer: Self-pay | Admitting: Neurology

## 2018-10-13 NOTE — Telephone Encounter (Signed)
Her next available Botox day is March. I have placed her on the schedule - November 13, 2018 at 2:30, if you could please let her know that would be the soonest available. Thanks.

## 2018-10-13 NOTE — Telephone Encounter (Signed)
Patient is aware of the date and time thank you

## 2018-10-13 NOTE — Telephone Encounter (Signed)
Patient will not be able to make her Botox on 10-17-18 and needs to resch when can she resch appt for please advise thank you

## 2018-10-16 ENCOUNTER — Telehealth: Payer: Self-pay | Admitting: Neurology

## 2018-10-16 NOTE — Telephone Encounter (Signed)
Marianna Fuss from [place] [facility] called about a non-par provider and needing additional info for this patient. Please call her back at [phone]. Thanks!

## 2018-10-16 NOTE — Telephone Encounter (Signed)
Spoke with Marianna Fuss and she states authorization approved but with out of network benefits.

## 2018-10-17 ENCOUNTER — Ambulatory Visit: Payer: Self-pay | Admitting: Neurology

## 2018-11-13 ENCOUNTER — Ambulatory Visit: Payer: Self-pay | Admitting: Neurology

## 2018-11-14 ENCOUNTER — Encounter: Payer: Self-pay | Admitting: Neurology

## 2018-11-14 NOTE — Progress Notes (Signed)
Botox authorization valid through 10/15/2019. Patient can use buy and bill.

## 2018-11-25 ENCOUNTER — Telehealth: Payer: Self-pay | Admitting: Neurology

## 2018-11-25 NOTE — Telephone Encounter (Signed)
Call patient and find out if eyes still open since we had to cancel botox due to coronavirus pandemic.  Just checking on her.

## 2018-11-25 NOTE — Telephone Encounter (Signed)
Left message for patient to call me back. 

## 2018-12-02 ENCOUNTER — Telehealth: Payer: Self-pay | Admitting: *Deleted

## 2018-12-02 NOTE — Telephone Encounter (Signed)
Darl Pikes is checking to see if patient is going to be seen on Friday.

## 2018-12-02 NOTE — Telephone Encounter (Signed)
Manus Gunning called to check on prior auth for Botox appointment coming up because she had changed insurance. I informed her that looking into her chart, all documents were scanned and it was approved with [employer] for 1 year. She is good to go.

## 2018-12-05 ENCOUNTER — Ambulatory Visit (INDEPENDENT_AMBULATORY_CARE_PROVIDER_SITE_OTHER): Payer: BLUE CROSS/BLUE SHIELD | Admitting: Neurology

## 2018-12-05 ENCOUNTER — Other Ambulatory Visit: Payer: Self-pay

## 2018-12-05 DIAGNOSIS — G245 Blepharospasm: Secondary | ICD-10-CM

## 2018-12-05 MED ORDER — ONABOTULINUMTOXINA 100 UNITS IJ SOLR
40.0000 [IU] | Freq: Once | INTRAMUSCULAR | Status: AC
  Administered 2018-12-05: 14:00:00 40 [IU] via INTRAMUSCULAR

## 2018-12-05 NOTE — Procedures (Signed)
[facility]   Procedure Note Botox  Attending: Dr. Lurena Joiner Tat  Preoperative Diagnosis(es): Blepharospasm; meige syndrome  Clinical comment:  Pt pleased with efficacy of botox without SE.     Consent obtained from: The patient Benefits discussed included, but were not limited to ability to open eyes and therefore see better.  Risk discussed included, but were not limited pain and discomfort, bleeding, bruising, excessive weakness, venous thrombosis, muscle atrophy and drooping of eyelids.  A copy of the patient medication guide was given to the patient which explains the blackbox warning.  Patients identity and treatment sites confirmed Yes.  .  Details of Procedure: Skin was cleaned with alcohol.  A 30 gauge, 1/2 inch needle was introduced to the target muscle.  Prior to injection, the needle plunger was aspirated to make sure the needle was not within a blood vessel.  There was no blood retrieved on aspiration.    Following is a summary of the muscles injected  And the amount of Botulinum toxin used:   Dilution 0.9% preservative free saline mixed with 100 u Botox type A to make 5 U per 0.1cc (2 cc of saline used per 100 U botox)  Injections  Location Left  Right Units  Upper lateral orbicularis oculi  2.5 2.5 5.0  Upper medial orbicularis oculi  2.5 2.5 5.0  Lateral orbicularis oculi  5.0 5.0 10  Lower lateral orbicularis oculi  2.5 2.5 5.0  Lower medial orbicularis oculi 2.5 2.5 5.0  Frontalis 2.5/2.5 2.5/2.5 10       TOTAL UNITS:   40   Agent: Botulinum Type A ( Onobotulinum Toxin type A ).  1 vials of Botox were used, each containing 50 units and freshly diluted with 2 mL of sterile, non-perserved saline   Total injected (Units): 40  Total wasted (Units): 0   Pt tolerated procedure well without complications.   Reinjection is anticipated in 3 months.

## 2018-12-27 IMAGING — DX DG CHEST 2V
2 series · 2 of 2 positions shown · non-contrast
Comparison: None.

CLINICAL DATA: Chronic cough and shortness of breath.

EXAM:
CHEST - 2 VIEW

[chest pa]
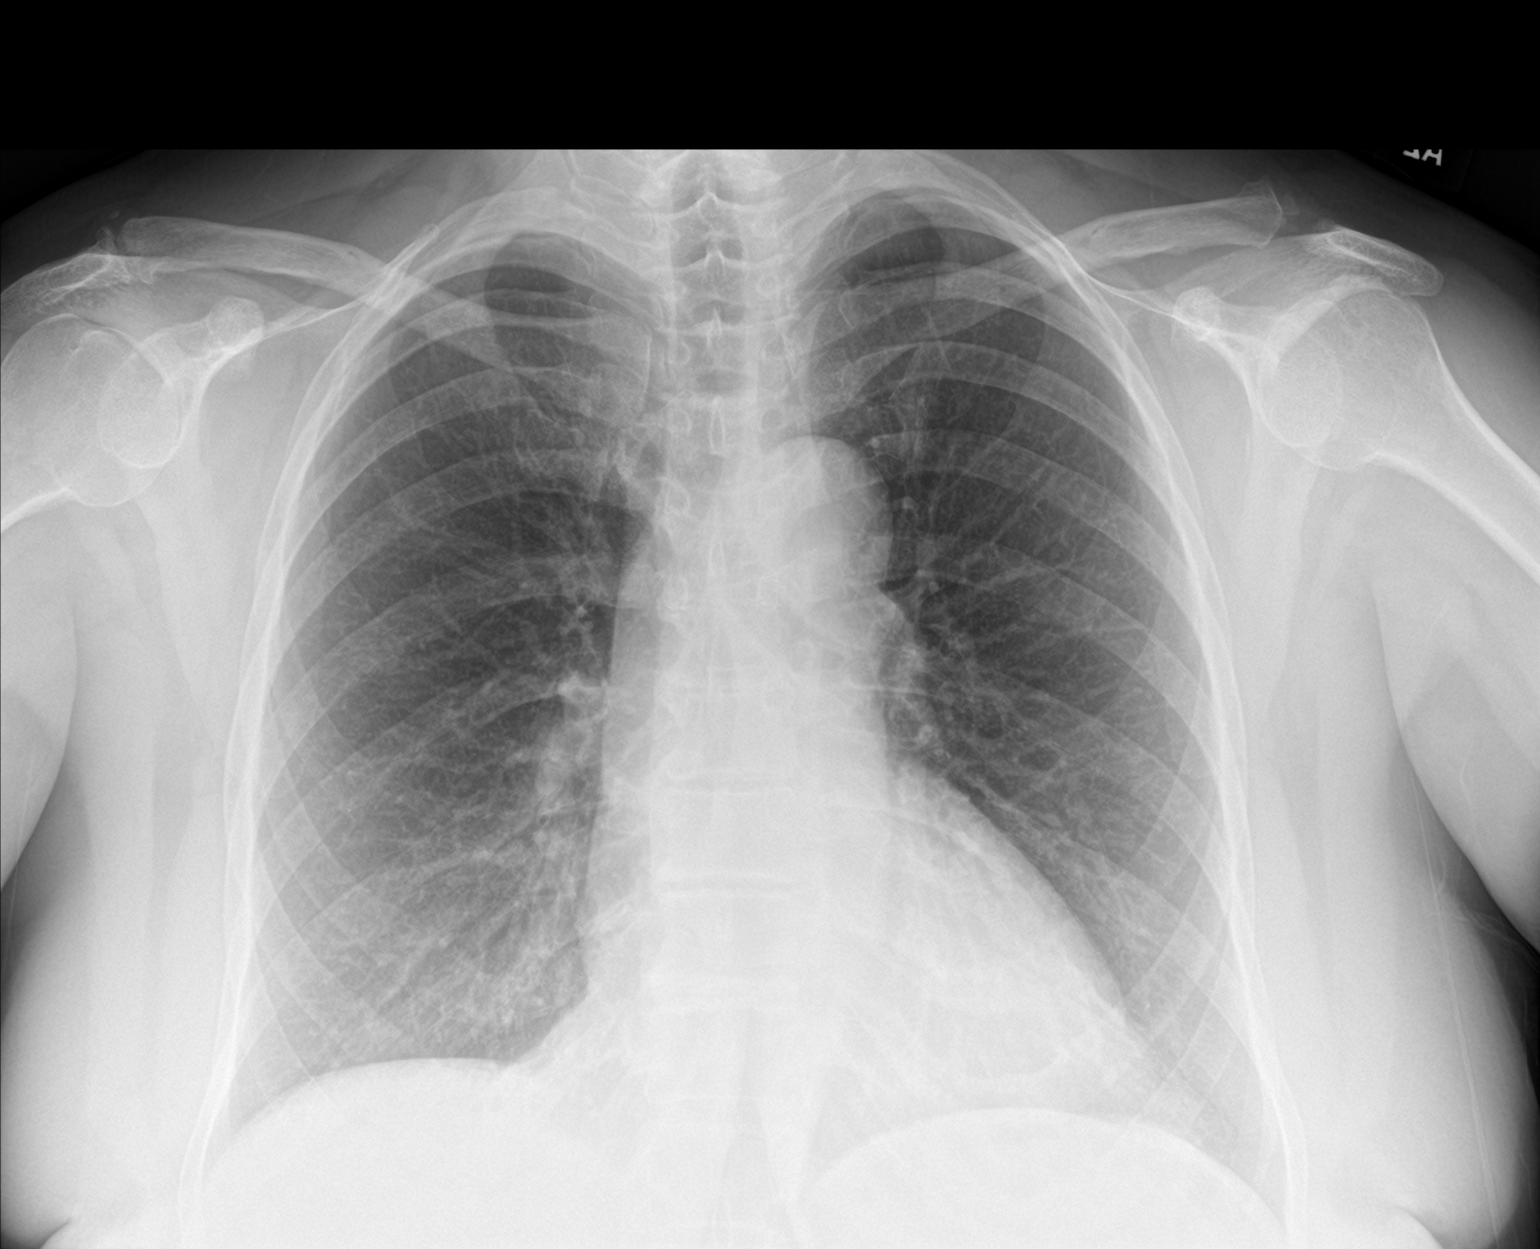

[chest lat]
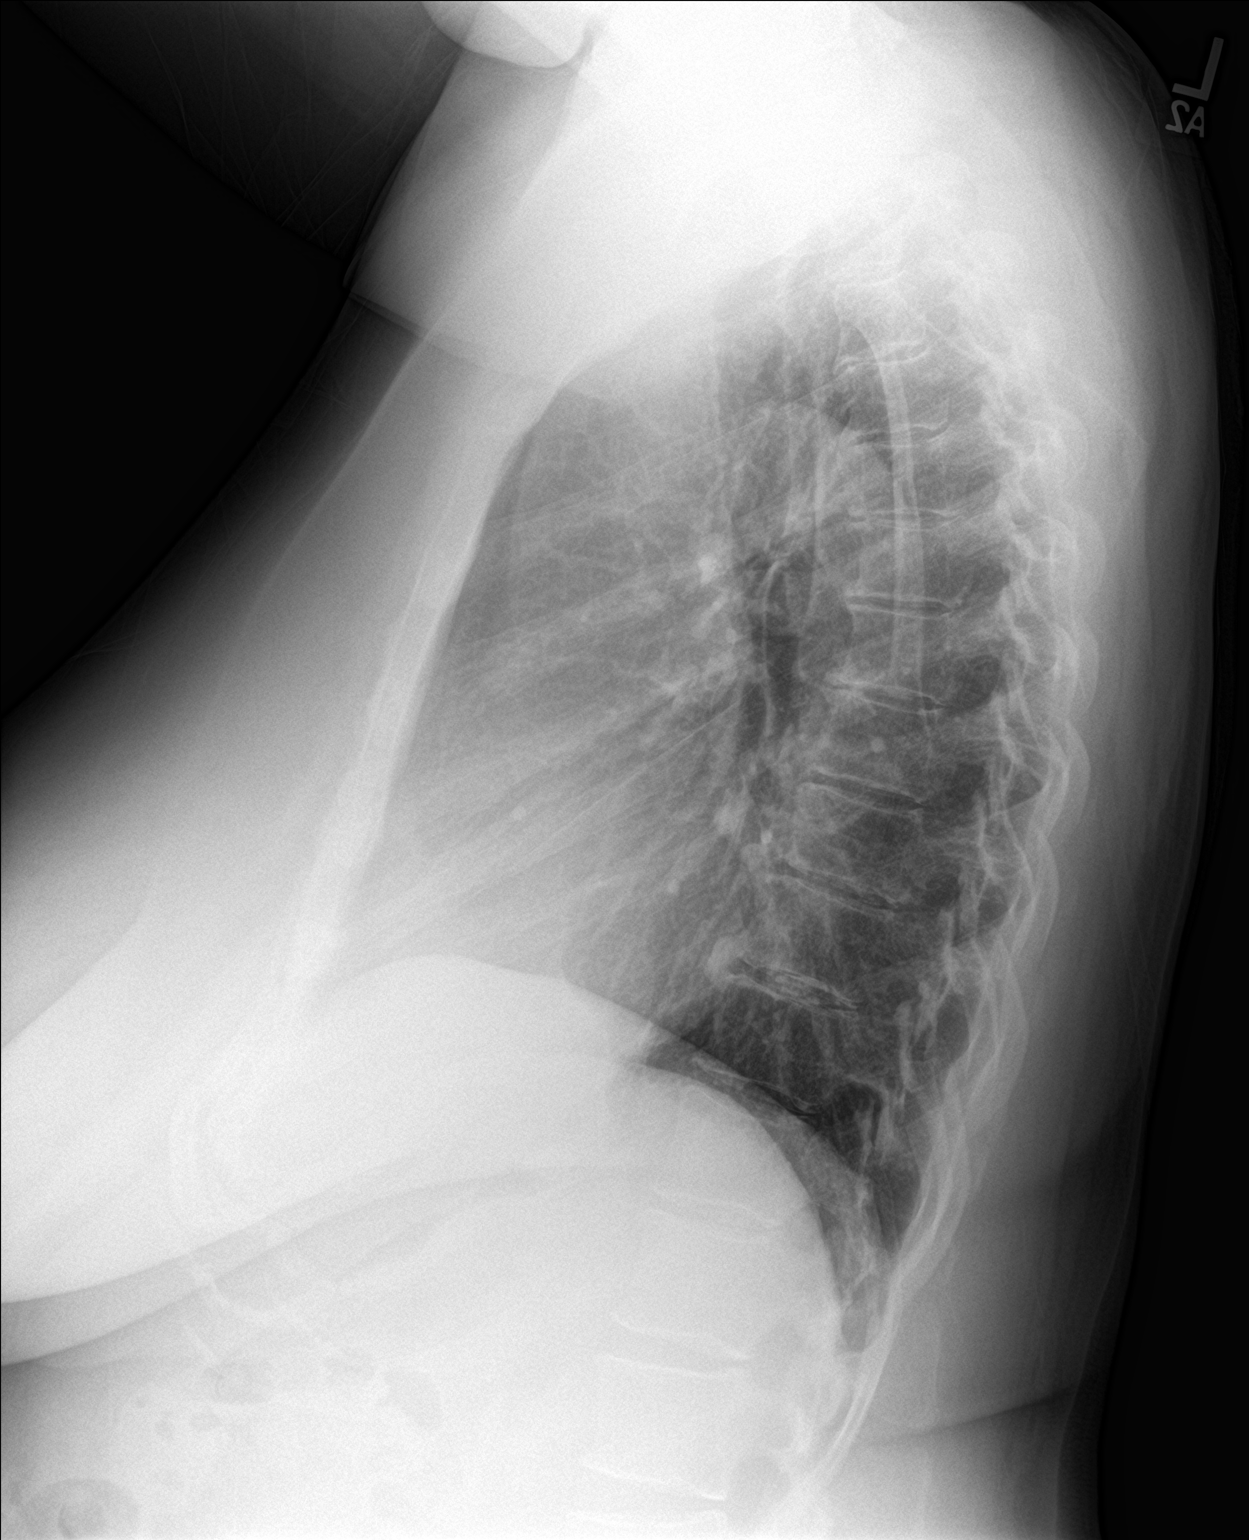

[2 of 2 positions shown; findings below may reference images not displayed]

FINDINGS: The heart size and mediastinal contours are within normal limits.
Both lungs are clear. The visualized skeletal structures are
unremarkable.
IMPRESSION: No active cardiopulmonary disease.

## 2019-03-06 ENCOUNTER — Ambulatory Visit: Payer: Self-pay | Admitting: Neurology

## 2019-06-05 ENCOUNTER — Ambulatory Visit: Payer: BLUE CROSS/BLUE SHIELD | Admitting: Neurology

## 2019-07-08 ENCOUNTER — Ambulatory Visit: Payer: Commercial Managed Care - PPO | Admitting: Obstetrics and Gynecology

## 2022-06-12 NOTE — Progress Notes (Unsigned)
Assessment/Plan:   Withdrawal emergent dyskinesia  -We have taken a good look at her medical records.  She previously had an MRI of the brain for same symptoms and this was negative.  The first time that this occurred, Abilify had been discontinued about 1 month prior.  This time, Abilify had also been discontinued just prior to the onset of symptoms.  Unlike last time, the symptoms were only in the face.  This time, she has symptoms in the face, tongue, lower extremities.  The most bothersome symptoms are in the face.  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  She was just started on Austedo and I agree with this.  She has only been on it for a few days.  She and I discussed the risks and benefits of this medication in detail.  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  In addition, she spends the weekends in the bed because of the eye symptoms.  I think it is reasonable to restart the Botox, especially while the Austedo kicks in.  We discussed risks, benefits, and side effects.  She was agreeable and would like to restart.  Subjective:   Deborah Chase was seen today in neurologic consultation at the request of Matthew Saras, DO.  The consultation is for the evaluation of tardive dyskinesia.  Patient has been seen in this practice previously.  She was last seen in April, 2020.  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  Medical records are reviewed.  Patient's symptoms started in September, 2017.  She had been on Abilify in the past, but it had been discontinued because of hand tremor.  Movements in the face began about 1 month after discontinuation of the Abilify (it did not happen  while on Abilify).  She was seen by ENT and was started on clonazepam, which did not help.  She started on Botox in February, 2018 and continued until April, 2020, which was very helpful.  She stopped Botox at the beginning of the pandemic and her symptoms never returned until very recently.  She states that she did pretty well until this year.  Records from primary care indicate that patient's psychiatric nurse practitioner had changed her medication in April, 2023.  She had apparently been on Abilify from 2019 until April, 2023 when Abilify was discontinued.  Pt states that the twitching didn't start until the abilify was stopped again.  About 3 months ago, she started to notice twitching in the eyes, movements in the legs, movements in the tongue and significant itching, especially in her ears.  She had lab work, including liver function testing that was normal.   No nurse practitioner records from psychiatry are available for review. She was given austedo 3-4 weeks ago but just started it on Sunday.  She called her cousin who is a Teacher, music at [employer] who told her to start the medication.    She is currently on 6 mg, titrating weekly to 24 mg.  She reports some movement in the feet but mostly in the face.     ALLERGIES:   Allergies  Allergen Reactions   Sulfa Antibiotics Swelling and Rash  Bumps in mouth    CURRENT MEDICATIONS:  Outpatient Encounter Medications as of 06/14/2022  Medication Sig   AUSTEDO XR 6 MG TB24 Take by mouth.   desvenlafaxine (PRISTIQ) 100 MG 24 hr tablet Take 100 mg by mouth daily.   DULoxetine (CYMBALTA) 30 MG capsule Take 1 capsule (30 mg total) by mouth daily.   hydrOXYzine (ATARAX/VISTARIL) 25 MG tablet Take 1 tablet by mouth as needed.   nystatin ointment (MYCOSTATIN) Apply 1 application topically as needed.   omeprazole (PRILOSEC) 40 MG capsule Take 40 mg by mouth daily.   oxybutynin (DITROPAN-XL) 10 MG 24 hr tablet TAKE 1 TABLET(10 MG) BY MOUTH DAILY    PREVIDENT 5000 SENSITIVE 1.1-5 % PSTE U PRN   ARIPiprazole (ABILIFY) 10 MG tablet Take 10 mg by mouth daily. (Patient not taking: Reported on 06/14/2022)   buPROPion (WELLBUTRIN XL) 300 MG 24 hr tablet Take 1 tablet (300 mg total) by mouth daily. (Patient not taking: Reported on 06/14/2022)   No facility-administered encounter medications on file as of 06/14/2022.    Objective:   PHYSICAL EXAMINATION:    VITALS:   Vitals:   06/14/22 0944  BP: (!) 165/88  Pulse: 83  SpO2: 96%  Weight: 220 lb 12.8 oz (100.2 kg)  Height: 5\' 6"  (1.676 m)    GEN:  Normal appears female in no acute distress.  Appears stated age. HEENT:  Normocephalic, atraumatic. The mucous membranes are moist.   NEUROLOGICAL: Orientation:  The patient is alert and oriented x 3.   Cranial nerves: There is good facial symmetry.  Extraocular muscles are intact and visual fields are full to confrontational testing. Speech is fluent and clear. Soft palate rises symmetrically and there is no tongue deviation. Hearing is intact to conversational tone. Tone: Tone is good throughout. Sensation: Sensation is intact to light touch throughout. Coordination:  The patient has no difficulty with RAM's or FNF bilaterally. Motor: Strength is 5/5 in the bilateral upper and lower extremities.  Shoulder shrug is equal and symmetric. There is no pronator drift.  There are no fasciculations noted. Gait and Station: The patient is able to ambulate without difficulty. The patient is able to heel toe walk without any difficulty. The patient is able to ambulate in a tandem fashion. The patient is able to stand in the Romberg position. Abnormal movements: There is blepharospasm, left greater than the right eye.  There is abnormal movements of the tongue.  It does not protrude much outside of the mouth, but occasionally.  There is mild abnormal involuntary movements of both legs.   Total time spent on today's visit was 42minutes, including both  face-to-face time and nonface-to-face time.  Time included that spent on review of records (prior notes available to me/labs/imaging if pertinent), discussing treatment and goals, answering patient's questions and coordinating care.   Cc:  Schoenhoff, Altamese Cabal, MD

## 2022-06-14 ENCOUNTER — Other Ambulatory Visit (HOSPITAL_COMMUNITY): Payer: Self-pay

## 2022-06-14 ENCOUNTER — Encounter: Payer: Self-pay | Admitting: Neurology

## 2022-06-14 ENCOUNTER — Ambulatory Visit: Payer: Medicare Other | Admitting: Neurology

## 2022-06-14 ENCOUNTER — Telehealth: Payer: Self-pay | Admitting: Pharmacy Technician

## 2022-06-14 VITALS — BP 165/88 | HR 83 | Ht 66.0 in | Wt 220.8 lb

## 2022-06-14 DIAGNOSIS — T43505A Adverse effect of unspecified antipsychotics and neuroleptics, initial encounter: Secondary | ICD-10-CM | POA: Diagnosis not present

## 2022-06-14 DIAGNOSIS — G2401 Drug induced subacute dyskinesia: Secondary | ICD-10-CM | POA: Diagnosis not present

## 2022-06-14 DIAGNOSIS — G245 Blepharospasm: Secondary | ICD-10-CM

## 2022-06-14 NOTE — Telephone Encounter (Signed)
Patient Advocate Encounter  Prior Authorization for Botox 100UNIT solution has been approved.    PA# [id number] Effective dates: 06/14/2022 through 09/14/2022  Can Be filled at [place], [place] Patient Advocate Specialist [place] Patient Advocate Team Direct Number: [phone]  Fax: [phone]

## 2022-06-14 NOTE — Telephone Encounter (Signed)
Patient Advocate Encounter   Received notification that prior authorization for Botox 100UNIT solution is required.   PA submitted on 06/14/2022 Key [id number] Status is pending       Lyndel Safe, [place] Patient Advocate Specialist [place] Patient Advocate Team Direct Number: [phone]  Fax: [phone]

## 2022-06-15 ENCOUNTER — Other Ambulatory Visit (HOSPITAL_COMMUNITY): Payer: Self-pay

## 2022-06-15 ENCOUNTER — Other Ambulatory Visit: Payer: Self-pay

## 2022-06-15 DIAGNOSIS — G245 Blepharospasm: Secondary | ICD-10-CM

## 2022-06-15 MED ORDER — BOTOX 100 UNITS IJ SOLR
100.0000 [IU] | Freq: Once | INTRAMUSCULAR | 0 refills | Status: DC
  Filled 2022-06-15: qty 1, 1d supply, fill #0
  Filled 2022-06-19: qty 1, 28d supply, fill #0

## 2022-06-15 NOTE — Telephone Encounter (Signed)
Prescription sent

## 2022-06-19 ENCOUNTER — Other Ambulatory Visit (HOSPITAL_COMMUNITY): Payer: Self-pay

## 2022-06-21 ENCOUNTER — Other Ambulatory Visit (HOSPITAL_COMMUNITY): Payer: Self-pay

## 2022-06-25 ENCOUNTER — Other Ambulatory Visit (HOSPITAL_COMMUNITY): Payer: Self-pay

## 2022-06-26 ENCOUNTER — Other Ambulatory Visit (HOSPITAL_COMMUNITY): Payer: Self-pay

## 2022-06-28 ENCOUNTER — Other Ambulatory Visit (HOSPITAL_COMMUNITY): Payer: Self-pay

## 2022-06-29 ENCOUNTER — Ambulatory Visit: Payer: Medicare Other | Admitting: Neurology

## 2022-06-29 ENCOUNTER — Encounter: Payer: Self-pay | Admitting: Neurology

## 2022-06-29 DIAGNOSIS — G245 Blepharospasm: Secondary | ICD-10-CM | POA: Diagnosis not present

## 2022-06-29 MED ORDER — ONABOTULINUMTOXINA 100 UNITS IJ SOLR
100.0000 [IU] | Freq: Once | INTRAMUSCULAR | Status: AC
  Administered 2022-06-29: 35 [IU] via INTRAMUSCULAR

## 2022-07-02 NOTE — Procedures (Signed)
[facility]   Procedure Note Botox  Attending: Dr. Lurena Joiner Laporscha Linehan  Preoperative Diagnosis(es): Blepharospasm;  Clinical comment:  Pt pleased with efficacy of botox without SE.     Consent obtained from: The patient Benefits discussed included, but were not limited to ability to open eyes and therefore see better.  Risk discussed included, but were not limited pain and discomfort, bleeding, bruising, excessive weakness, venous thrombosis, muscle atrophy and drooping of eyelids.  A copy of the patient medication guide was given to the patient which explains the blackbox warning.  Patients identity and treatment sites confirmed Yes.  .  Details of Procedure: Skin was cleaned with alcohol.  A 30 gauge, 1/2 inch needle was introduced to the target muscle.  Prior to injection, the needle plunger was aspirated to make sure the needle was not within a blood vessel.  There was no blood retrieved on aspiration.    Following is a summary of the muscles injected  And the amount of Botulinum toxin used:   Dilution 0.9% preservative free saline mixed with 100 u Botox type A to make 5 U per 0.1cc (2 cc of saline used per 100 U botox)  Injections  Location Left  Right Units  Upper lateral orbicularis oculi  2.5 2.5 5.0  Upper medial orbicularis oculi      Lateral orbicularis oculi  5.0 5.0 10  Lower lateral orbicularis oculi  2.5 2.5 5.0  Lower medial orbicularis oculi 2.5 2.5 5.0  Frontalis 2.5/2.5 2.5/2.5 10       TOTAL UNITS:   35   Agent: Botulinum Type A ( Onobotulinum Toxin type A ).  1 vials of Botox were used, each containing 50 units and freshly diluted with 2 mL of sterile, non-perserved saline   Total injected (Units): 35  Total wasted (Units): 65   Pt tolerated procedure well without complications.   Reinjection is anticipated in 3 months.

## 2022-07-19 ENCOUNTER — Other Ambulatory Visit (HOSPITAL_COMMUNITY): Payer: Self-pay

## 2022-09-11 ENCOUNTER — Other Ambulatory Visit (HOSPITAL_COMMUNITY): Payer: Self-pay

## 2022-09-11 ENCOUNTER — Other Ambulatory Visit: Payer: Self-pay | Admitting: Neurology

## 2022-09-11 DIAGNOSIS — G245 Blepharospasm: Secondary | ICD-10-CM

## 2022-09-11 MED ORDER — BOTOX 100 UNITS IJ SOLR
100.0000 [IU] | Freq: Once | INTRAMUSCULAR | 1 refills | Status: AC
  Filled 2022-09-11: qty 1, 90d supply, fill #0
  Filled 2022-11-21: qty 1, 90d supply, fill #1

## 2022-09-19 ENCOUNTER — Other Ambulatory Visit (HOSPITAL_COMMUNITY): Payer: Self-pay

## 2022-09-20 ENCOUNTER — Other Ambulatory Visit (HOSPITAL_COMMUNITY): Payer: Self-pay

## 2022-09-28 ENCOUNTER — Ambulatory Visit: Payer: Medicare Other | Admitting: Neurology

## 2022-09-28 DIAGNOSIS — G245 Blepharospasm: Secondary | ICD-10-CM | POA: Diagnosis not present

## 2022-09-28 MED ORDER — ONABOTULINUMTOXINA 100 UNITS IJ SOLR
100.0000 [IU] | Freq: Once | INTRAMUSCULAR | Status: AC
  Administered 2022-09-28: 40 [IU] via INTRAMUSCULAR

## 2022-09-28 NOTE — Procedures (Signed)
[facility]   Procedure Note Botox  Attending: Dr. Wells Guiles Sharnelle Cappelli  Preoperative Diagnosis(es): Blepharospasm;  Clinical comment:  did well; wore off 2 weeks ago    Consent obtained from: The patient Benefits discussed included, but were not limited to ability to open eyes and therefore see better.  Risk discussed included, but were not limited pain and discomfort, bleeding, bruising, excessive weakness, venous thrombosis, muscle atrophy and drooping of eyelids.  A copy of the patient medication guide was given to the patient which explains the blackbox warning.  Patients identity and treatment sites confirmed Yes.  .  Details of Procedure: Skin was cleaned with alcohol.  A 30 gauge, 1/2 inch needle was introduced to the target muscle.  Prior to injection, the needle plunger was aspirated to make sure the needle was not within a blood vessel.  There was no blood retrieved on aspiration.    Following is a summary of the muscles injected  And the amount of Botulinum toxin used:   Dilution 0.9% preservative free saline mixed with 100 u Botox type A to make 5 U per 0.1cc (2 cc of saline used per 100 U botox)  Injections  Location Left  Right Units  Upper lateral orbicularis oculi  2.5 2.5 5.0  Upper medial orbicularis oculi      Lateral orbicularis oculi  5.0 5.0 10  Lower lateral orbicularis oculi  2.5 2.5 5.0  Lower medial orbicularis oculi 2.5 2.5 5.0  Frontalis 2.5/2.5 2.5/2.5 10  nasalis 2.5 2.5 5  TOTAL UNITS:   40   Agent: Botulinum Type A ( Onobotulinum Toxin type A ).  1 vials of Botox were used, each containing 50 units and freshly diluted with 2 mL of sterile, non-perserved saline   Total injected (Units): 40  Total wasted (Units): 60   Pt tolerated procedure well without complications.   Reinjection is anticipated in 3 months.

## 2022-11-21 ENCOUNTER — Other Ambulatory Visit (HOSPITAL_COMMUNITY): Payer: Self-pay

## 2022-12-19 ENCOUNTER — Other Ambulatory Visit: Payer: Self-pay

## 2022-12-19 ENCOUNTER — Other Ambulatory Visit (HOSPITAL_COMMUNITY): Payer: Self-pay

## 2022-12-28 ENCOUNTER — Other Ambulatory Visit: Payer: Self-pay

## 2022-12-28 ENCOUNTER — Ambulatory Visit: Payer: Medicare Other | Admitting: Neurology

## 2023-01-04 ENCOUNTER — Ambulatory Visit: Payer: Medicare Other | Admitting: Neurology

## 2023-03-12 ENCOUNTER — Other Ambulatory Visit (HOSPITAL_COMMUNITY): Payer: Self-pay

## 2023-03-18 ENCOUNTER — Other Ambulatory Visit: Payer: Self-pay
